# Patient Record
Sex: Female | Born: 1970 | Race: White | Hispanic: No | Marital: Single | Smoking: Former smoker
Health system: Southern US, Community
[De-identification: ages and names within clinical notes are randomized; demographics above are authoritative.]

## PROBLEM LIST (undated history)

## (undated) DIAGNOSIS — E669 Obesity, unspecified: Secondary | ICD-10-CM

## (undated) DIAGNOSIS — G4730 Sleep apnea, unspecified: Secondary | ICD-10-CM

## (undated) DIAGNOSIS — E119 Type 2 diabetes mellitus without complications: Secondary | ICD-10-CM

## (undated) DIAGNOSIS — M1990 Unspecified osteoarthritis, unspecified site: Secondary | ICD-10-CM

## (undated) DIAGNOSIS — I10 Essential (primary) hypertension: Secondary | ICD-10-CM

## (undated) DIAGNOSIS — G629 Polyneuropathy, unspecified: Secondary | ICD-10-CM

## (undated) HISTORY — DX: Sleep apnea, unspecified: G47.30

## (undated) HISTORY — PX: LITHOTRIPSY: SUR834

## (undated) HISTORY — PX: ABDOMINAL HYSTERECTOMY: SHX81

## (undated) HISTORY — PX: ABDOMINAL SURGERY: SHX537

## (undated) HISTORY — DX: Obesity, unspecified: E66.9

## (undated) HISTORY — PX: TONSILLECTOMY: SUR1361

## (undated) HISTORY — PX: CARPAL TUNNEL RELEASE: SHX101

---

## 1999-07-29 ENCOUNTER — Other Ambulatory Visit: Admission: RE | Admit: 1999-07-29 | Discharge: 1999-07-29 | Payer: Self-pay | Admitting: Obstetrics and Gynecology

## 1999-11-26 ENCOUNTER — Emergency Department (HOSPITAL_COMMUNITY): Admission: EM | Admit: 1999-11-26 | Discharge: 1999-11-26 | Payer: Self-pay

## 2000-03-27 ENCOUNTER — Encounter (INDEPENDENT_AMBULATORY_CARE_PROVIDER_SITE_OTHER): Payer: Self-pay

## 2000-03-27 ENCOUNTER — Other Ambulatory Visit: Admission: RE | Admit: 2000-03-27 | Discharge: 2000-03-27 | Payer: Self-pay | Admitting: Otolaryngology

## 2000-07-27 ENCOUNTER — Emergency Department (HOSPITAL_COMMUNITY): Admission: EM | Admit: 2000-07-27 | Discharge: 2000-07-27 | Payer: Self-pay | Admitting: Emergency Medicine

## 2000-07-27 ENCOUNTER — Encounter: Payer: Self-pay | Admitting: *Deleted

## 2000-08-15 ENCOUNTER — Encounter (INDEPENDENT_AMBULATORY_CARE_PROVIDER_SITE_OTHER): Payer: Self-pay | Admitting: Internal Medicine

## 2000-08-15 ENCOUNTER — Ambulatory Visit (HOSPITAL_COMMUNITY): Admission: RE | Admit: 2000-08-15 | Discharge: 2000-08-15 | Payer: Self-pay | Admitting: Internal Medicine

## 2000-08-20 ENCOUNTER — Other Ambulatory Visit: Admission: RE | Admit: 2000-08-20 | Discharge: 2000-08-20 | Payer: Self-pay | Admitting: Obstetrics and Gynecology

## 2000-08-29 ENCOUNTER — Ambulatory Visit (HOSPITAL_COMMUNITY): Admission: RE | Admit: 2000-08-29 | Discharge: 2000-08-29 | Payer: Self-pay | Admitting: Internal Medicine

## 2000-08-29 ENCOUNTER — Encounter: Payer: Self-pay | Admitting: Internal Medicine

## 2000-09-05 ENCOUNTER — Encounter (INDEPENDENT_AMBULATORY_CARE_PROVIDER_SITE_OTHER): Payer: Self-pay | Admitting: Internal Medicine

## 2000-09-05 ENCOUNTER — Ambulatory Visit (HOSPITAL_COMMUNITY): Admission: RE | Admit: 2000-09-05 | Discharge: 2000-09-05 | Payer: Self-pay | Admitting: *Deleted

## 2000-09-30 ENCOUNTER — Emergency Department (HOSPITAL_COMMUNITY): Admission: EM | Admit: 2000-09-30 | Discharge: 2000-09-30 | Payer: Self-pay | Admitting: Emergency Medicine

## 2000-10-15 ENCOUNTER — Encounter: Payer: Self-pay | Admitting: Obstetrics and Gynecology

## 2000-10-15 ENCOUNTER — Inpatient Hospital Stay (HOSPITAL_COMMUNITY): Admission: AD | Admit: 2000-10-15 | Discharge: 2000-10-15 | Payer: Self-pay | Admitting: Obstetrics and Gynecology

## 2000-10-15 ENCOUNTER — Encounter: Admission: RE | Admit: 2000-10-15 | Discharge: 2000-10-15 | Payer: Self-pay | Admitting: Obstetrics and Gynecology

## 2001-03-01 ENCOUNTER — Encounter: Payer: Self-pay | Admitting: *Deleted

## 2001-03-01 ENCOUNTER — Emergency Department (HOSPITAL_COMMUNITY): Admission: EM | Admit: 2001-03-01 | Discharge: 2001-03-01 | Payer: Self-pay | Admitting: *Deleted

## 2001-04-18 ENCOUNTER — Emergency Department (HOSPITAL_COMMUNITY): Admission: EM | Admit: 2001-04-18 | Discharge: 2001-04-18 | Payer: Self-pay | Admitting: *Deleted

## 2001-07-22 ENCOUNTER — Emergency Department (HOSPITAL_COMMUNITY): Admission: EM | Admit: 2001-07-22 | Discharge: 2001-07-22 | Payer: Self-pay | Admitting: Emergency Medicine

## 2001-11-04 ENCOUNTER — Other Ambulatory Visit: Admission: RE | Admit: 2001-11-04 | Discharge: 2001-11-04 | Payer: Self-pay | Admitting: Obstetrics and Gynecology

## 2001-11-06 ENCOUNTER — Observation Stay (HOSPITAL_COMMUNITY): Admission: RE | Admit: 2001-11-06 | Discharge: 2001-11-07 | Payer: Self-pay | Admitting: Obstetrics and Gynecology

## 2001-11-06 ENCOUNTER — Encounter (INDEPENDENT_AMBULATORY_CARE_PROVIDER_SITE_OTHER): Payer: Self-pay

## 2005-01-08 ENCOUNTER — Emergency Department (HOSPITAL_COMMUNITY): Admission: EM | Admit: 2005-01-08 | Discharge: 2005-01-08 | Payer: Self-pay | Admitting: Emergency Medicine

## 2005-01-09 ENCOUNTER — Inpatient Hospital Stay (HOSPITAL_COMMUNITY): Admission: EM | Admit: 2005-01-09 | Discharge: 2005-01-12 | Payer: Self-pay | Admitting: Emergency Medicine

## 2005-01-10 ENCOUNTER — Encounter (INDEPENDENT_AMBULATORY_CARE_PROVIDER_SITE_OTHER): Payer: Self-pay | Admitting: *Deleted

## 2005-01-10 ENCOUNTER — Encounter: Payer: Self-pay | Admitting: Obstetrics and Gynecology

## 2006-06-27 ENCOUNTER — Ambulatory Visit (HOSPITAL_BASED_OUTPATIENT_CLINIC_OR_DEPARTMENT_OTHER): Admission: RE | Admit: 2006-06-27 | Discharge: 2006-06-27 | Payer: Self-pay | Admitting: *Deleted

## 2006-12-30 ENCOUNTER — Emergency Department (HOSPITAL_COMMUNITY): Admission: EM | Admit: 2006-12-30 | Discharge: 2006-12-30 | Payer: Self-pay | Admitting: Emergency Medicine

## 2008-02-28 ENCOUNTER — Encounter: Payer: Self-pay | Admitting: Orthopedic Surgery

## 2008-02-28 ENCOUNTER — Ambulatory Visit (HOSPITAL_COMMUNITY): Admission: RE | Admit: 2008-02-28 | Discharge: 2008-02-28 | Payer: Self-pay | Admitting: Family Medicine

## 2008-03-17 ENCOUNTER — Ambulatory Visit (HOSPITAL_COMMUNITY): Admission: RE | Admit: 2008-03-17 | Discharge: 2008-03-17 | Payer: Self-pay | Admitting: Family Medicine

## 2008-03-17 ENCOUNTER — Encounter: Payer: Self-pay | Admitting: Orthopedic Surgery

## 2008-05-20 ENCOUNTER — Ambulatory Visit: Payer: Self-pay | Admitting: Orthopedic Surgery

## 2008-05-20 DIAGNOSIS — M549 Dorsalgia, unspecified: Secondary | ICD-10-CM | POA: Insufficient documentation

## 2008-05-21 ENCOUNTER — Encounter: Payer: Self-pay | Admitting: Orthopedic Surgery

## 2008-05-25 ENCOUNTER — Encounter: Payer: Self-pay | Admitting: Orthopedic Surgery

## 2008-05-28 ENCOUNTER — Encounter: Payer: Self-pay | Admitting: Orthopedic Surgery

## 2008-06-02 ENCOUNTER — Ambulatory Visit: Payer: Self-pay | Admitting: Orthopedic Surgery

## 2008-07-28 ENCOUNTER — Encounter (HOSPITAL_COMMUNITY): Admission: RE | Admit: 2008-07-28 | Discharge: 2008-08-27 | Payer: Self-pay | Admitting: Orthopedic Surgery

## 2008-07-28 ENCOUNTER — Encounter: Payer: Self-pay | Admitting: Orthopedic Surgery

## 2008-08-28 ENCOUNTER — Encounter (HOSPITAL_COMMUNITY): Admission: RE | Admit: 2008-08-28 | Discharge: 2008-09-27 | Payer: Self-pay | Admitting: Orthopedic Surgery

## 2008-08-31 ENCOUNTER — Encounter: Payer: Self-pay | Admitting: Orthopedic Surgery

## 2008-12-16 ENCOUNTER — Encounter: Payer: Self-pay | Admitting: Orthopedic Surgery

## 2009-03-03 ENCOUNTER — Ambulatory Visit (HOSPITAL_COMMUNITY): Payer: Self-pay | Admitting: Oncology

## 2009-03-03 ENCOUNTER — Encounter (HOSPITAL_COMMUNITY): Admission: RE | Admit: 2009-03-03 | Discharge: 2009-04-02 | Payer: Self-pay | Admitting: Oncology

## 2009-03-10 ENCOUNTER — Ambulatory Visit (HOSPITAL_COMMUNITY): Admission: RE | Admit: 2009-03-10 | Discharge: 2009-03-10 | Payer: Self-pay | Admitting: Oncology

## 2009-04-15 ENCOUNTER — Encounter (HOSPITAL_COMMUNITY): Admission: RE | Admit: 2009-04-15 | Discharge: 2009-05-15 | Payer: Self-pay | Admitting: Oncology

## 2009-06-07 ENCOUNTER — Encounter (HOSPITAL_COMMUNITY): Admission: RE | Admit: 2009-06-07 | Discharge: 2009-07-07 | Payer: Self-pay | Admitting: Oncology

## 2009-06-07 ENCOUNTER — Ambulatory Visit (HOSPITAL_COMMUNITY): Payer: Self-pay | Admitting: Oncology

## 2009-08-16 ENCOUNTER — Encounter (HOSPITAL_COMMUNITY): Admission: RE | Admit: 2009-08-16 | Discharge: 2009-09-15 | Payer: Self-pay | Admitting: Oncology

## 2009-08-16 ENCOUNTER — Ambulatory Visit (HOSPITAL_COMMUNITY): Payer: Self-pay | Admitting: Oncology

## 2010-01-23 ENCOUNTER — Encounter: Payer: Self-pay | Admitting: Family Medicine

## 2010-02-14 ENCOUNTER — Other Ambulatory Visit (HOSPITAL_COMMUNITY): Payer: Self-pay

## 2010-02-16 ENCOUNTER — Ambulatory Visit (HOSPITAL_COMMUNITY): Payer: Self-pay | Admitting: Oncology

## 2010-03-17 LAB — CBC
Hemoglobin: 15.3 g/dL — ABNORMAL HIGH (ref 12.0–15.0)
MCH: 31.3 pg (ref 26.0–34.0)
MCV: 90.8 fL (ref 78.0–100.0)
RBC: 4.89 MIL/uL (ref 3.87–5.11)

## 2010-03-17 LAB — DIFFERENTIAL
Basophils Absolute: 0.1 10*3/uL (ref 0.0–0.1)
Eosinophils Absolute: 0.2 10*3/uL (ref 0.0–0.7)
Monocytes Absolute: 0.8 10*3/uL (ref 0.1–1.0)
Monocytes Relative: 7 % (ref 3–12)
Neutro Abs: 5.6 10*3/uL (ref 1.7–7.7)

## 2010-03-20 ENCOUNTER — Emergency Department (HOSPITAL_COMMUNITY)
Admission: EM | Admit: 2010-03-20 | Discharge: 2010-03-20 | Disposition: A | Discharge status: Home / Self Care | Payer: 59 | Attending: Emergency Medicine | Admitting: Emergency Medicine

## 2010-03-20 DIAGNOSIS — I10 Essential (primary) hypertension: Secondary | ICD-10-CM | POA: Insufficient documentation

## 2010-03-20 DIAGNOSIS — Z79899 Other long term (current) drug therapy: Secondary | ICD-10-CM | POA: Insufficient documentation

## 2010-03-20 DIAGNOSIS — R1031 Right lower quadrant pain: Secondary | ICD-10-CM | POA: Insufficient documentation

## 2010-03-20 LAB — URINALYSIS, ROUTINE W REFLEX MICROSCOPIC
Bilirubin Urine: NEGATIVE
Ketones, ur: NEGATIVE mg/dL
Nitrite: NEGATIVE
Protein, ur: NEGATIVE mg/dL
Specific Gravity, Urine: 1.025 (ref 1.005–1.030)
pH: 6 (ref 5.0–8.0)

## 2010-03-20 LAB — DIFFERENTIAL
Lymphocytes Relative: 46 % (ref 12–46)
Lymphs Abs: 6.2 10*3/uL — ABNORMAL HIGH (ref 0.7–4.0)
Monocytes Absolute: 1 10*3/uL (ref 0.1–1.0)
Monocytes Relative: 8 % (ref 3–12)
Neutro Abs: 6.1 10*3/uL (ref 1.7–7.7)
Neutrophils Relative %: 45 % (ref 43–77)

## 2010-03-20 LAB — COMPREHENSIVE METABOLIC PANEL
ALT: 31 U/L (ref 0–35)
Albumin: 4.1 g/dL (ref 3.5–5.2)
Alkaline Phosphatase: 87 U/L (ref 39–117)
Calcium: 9.4 mg/dL (ref 8.4–10.5)
Creatinine, Ser: 0.81 mg/dL (ref 0.4–1.2)
GFR calc Af Amer: >60 mL/min (ref >60)

## 2010-03-20 LAB — URINE MICROSCOPIC-ADD ON

## 2010-03-20 LAB — CBC
MCH: 31.2 pg (ref 26.0–34.0)
MCV: 90.2 fL (ref 78.0–100.0)
Platelets: 248 10*3/uL (ref 150–400)
RDW: 13 % (ref 11.5–15.5)

## 2010-03-21 LAB — DIFFERENTIAL
Basophils Absolute: 0.1 10*3/uL (ref 0.0–0.1)
Basophils Relative: 1 % (ref 0–1)
Lymphocytes Relative: 42 % (ref 12–46)
Lymphs Abs: 4.8 10*3/uL — ABNORMAL HIGH (ref 0.7–4.0)
Monocytes Absolute: 0.7 10*3/uL (ref 0.1–1.0)

## 2010-03-21 LAB — CBC
HCT: 44.2 % (ref 36.0–46.0)
MCHC: 34.4 g/dL (ref 30.0–36.0)
MCV: 90.8 fL (ref 78.0–100.0)
Platelets: 255 10*3/uL (ref 150–400)
RDW: 12.7 % (ref 11.5–15.5)

## 2010-03-23 LAB — CBC
HCT: 42.5 % (ref 36.0–46.0)
Hemoglobin: 15.1 g/dL — ABNORMAL HIGH (ref 12.0–15.0)
MCHC: 35.6 g/dL (ref 30.0–36.0)
Platelets: 260 10*3/uL (ref 150–400)
RBC: 4.76 MIL/uL (ref 3.87–5.11)

## 2010-03-23 LAB — DIFFERENTIAL
Basophils Absolute: 0 10*3/uL (ref 0.0–0.1)
Eosinophils Absolute: 0.2 10*3/uL (ref 0.0–0.7)
Lymphs Abs: 5 10*3/uL — ABNORMAL HIGH (ref 0.7–4.0)
Monocytes Relative: 7 % (ref 3–12)

## 2010-03-25 LAB — DIFFERENTIAL
Eosinophils Absolute: 0.2 10*3/uL (ref 0.0–0.7)
Eosinophils Relative: 1 % (ref 0–5)
Lymphocytes Relative: 36 % (ref 12–46)
Monocytes Absolute: 0.7 10*3/uL (ref 0.1–1.0)
Monocytes Relative: 5 % (ref 3–12)
Neutro Abs: 7.4 10*3/uL (ref 1.7–7.7)
Neutrophils Relative %: 57 % (ref 43–77)

## 2010-03-25 LAB — CBC
MCHC: 34.7 g/dL (ref 30.0–36.0)
MCV: 92.2 fL (ref 78.0–100.0)
WBC: 13 10*3/uL — ABNORMAL HIGH (ref 4.0–10.5)

## 2010-03-29 ENCOUNTER — Other Ambulatory Visit (HOSPITAL_COMMUNITY): Payer: Self-pay | Admitting: Internal Medicine

## 2010-03-31 ENCOUNTER — Other Ambulatory Visit (HOSPITAL_COMMUNITY): Payer: 59

## 2010-04-04 ENCOUNTER — Ambulatory Visit (HOSPITAL_COMMUNITY)
Admission: RE | Admit: 2010-04-04 | Discharge: 2010-04-04 | Disposition: A | Discharge status: Home / Self Care | Payer: 59 | Source: Ambulatory Visit | Attending: Internal Medicine | Admitting: Internal Medicine

## 2010-04-04 ENCOUNTER — Encounter (HOSPITAL_COMMUNITY): Payer: Self-pay

## 2010-04-04 DIAGNOSIS — N200 Calculus of kidney: Secondary | ICD-10-CM | POA: Insufficient documentation

## 2010-04-04 DIAGNOSIS — R109 Unspecified abdominal pain: Secondary | ICD-10-CM | POA: Insufficient documentation

## 2010-04-04 DIAGNOSIS — R160 Hepatomegaly, not elsewhere classified: Secondary | ICD-10-CM | POA: Insufficient documentation

## 2010-04-04 HISTORY — DX: Essential (primary) hypertension: I10

## 2010-04-04 MED ORDER — IOHEXOL 300 MG/ML  SOLN
100.0000 mL | Freq: Once | INTRAMUSCULAR | Status: AC | PRN
  Administered 2010-04-04: 100 mL via INTRAVENOUS

## 2010-05-17 NOTE — Op Note (Signed)
Kimberly Marks, Kimberly Marks                ACCOUNT NO.:  [id number]   MEDICAL RECORD NO.:  [id number]          PATIENT TYPE:  AMB   LOCATION:  DSC                          FACILITY:  [facility]   PHYSICIAN:  Tennis Must Meyerdierks, M.D.DATE OF BIRTH:  07-06-1970   DATE OF PROCEDURE:  06/27/2006  DATE OF DISCHARGE:                               OPERATIVE REPORT   PREOPERATIVE DIAGNOSIS:  Left carpal tunnel syndrome.   POSTOPERATIVE DIAGNOSIS:  Left carpal tunnel syndrome.   PROCEDURE:  Decompression median nerve left carpal tunnel.   SURGEON:  Lowell Bouton, M.D.   ANESTHESIA:  Half percent Marcaine local with sedation.   OPERATIVE FINDINGS:  The patient had no masses in the carpal canal.  The  motor branch of the nerve was intact.   PROCEDURE:  Under half percent Marcaine local anesthesia with a  tourniquet on the left arm, the left hand was prepped and draped in  usual fashion and after exsanguinating the limb, the tourniquet was  inflated to 250 mmHg.  A 3 cm longitudinal incision was made in the palm  just ulnar to the thenar crease.  Sharp dissection was carried through  the subcutaneous tissues.  Blunt dissection was carried through the  superficial palmar fascia distal to the transverse carpal ligament.  A  hemostat was then placed in the carpal canal up against the hook of  hamate.  The transverse carpal ligament was divided on the ulnar border  of the median nerve and the proximal end of the ligament was divided  with the scissors after dissecting the nerve away from the undersurface  of the ligament.  The carpal canal was then palpated and was found to be  adequately decompressed.  The nerve was examined in the motor branch was  identified.  The wound was irrigated with saline.  The skin was closed  with 4-0 nylon sutures.  Sterile dressings were applied followed by a  volar wrist splint.  The patient tolerated the procedure well, went to  the recovery room awake in  stable condition.      Lowell Bouton, M.D.  Electronically Signed     EMM/MEDQ  D:  06/27/2006  T:  06/27/2006  Job:  [id number]   cc:   Kirk Ruths, M.D.

## 2010-05-20 NOTE — H&P (Signed)
NAMEZELIE, ASBILL                ACCOUNT NO.:  [id number]   MEDICAL RECORD NO.:  [id number]          PATIENT TYPE:  INP   LOCATION:  A417                          FACILITY:  [facility]   PHYSICIAN:  Tilda Burrow, M.D. DATE OF BIRTH:  1970-02-05   DATE OF ADMISSION:  01/09/2005  DATE OF DISCHARGE:  LH                                HISTORY & PHYSICAL   HISTORY OF PRESENT ILLNESS:  This 40 year old female was admitted to [facility] one day after being seen in the emergency room at [facility] for severe lower abdominal pain. She presents back to the [facility] emergency room on the morning of January 09, 2005 complaining  of worsening abdominal pain. When seen January 09, 2004 in the emergency  room, the patient was found to have two weeks of urgent pain reported to be  an acute level of 5/10. On the morning of January 09, 2005, she considers the  pain to be worse. It is interfering with activities. Since the patient is  seen back on January 09, 2005, she will be admitted.   X-ray reports from the day before have been reviewed which show a large  complex cystic and solid mass in the central pelvis measuring 15.4 x 11.4 cm  and maximal of AP diameter 10 cm in the third dimension. Surgically absent  uterus was present. There is no evidence of renal calculi. Laboratory data  included hemoglobin 16, hematocrit 42, white count 13,300. Electrolytes  normal. Urinalysis negative for evidence of infection.   PHYSICAL EXAMINATION:  GENERAL:  Shows a slim Caucasian female. Patient has  significant discomfort.   We will proceed toward surgery once laboratory work evaluation and bowel  prep on January 09, 2005 and surgery morning of January 10, 2005 planned.      Tilda Burrow, M.D.  Electronically Signed     JVF/MEDQ  D:  01/10/2005  T:  01/10/2005  Job:  [id number]

## 2010-05-20 NOTE — Op Note (Signed)
NAMEAASHKA, SALOMONE                ACCOUNT NO.:  [id number]   MEDICAL RECORD NO.:  [id number]          PATIENT TYPE:  INP   LOCATION:  A417                          FACILITY:  [facility]   PHYSICIAN:  Tilda Burrow, M.D. DATE OF BIRTH:  1970-02-17   DATE OF PROCEDURE:  01/10/2005  DATE OF DISCHARGE:                                 OPERATIVE REPORT   PREOPERATIVE DIAGNOSIS:  1.  Symptomatic pelvic mass  2.  Left thigh skin tag.   POSTOPERATIVE DIAGNOSIS:  1.  Right ovarian mass with partial torsion.  2.  Left thigh skin tag.   PROCEDURE:  1.  Laparotomy with bilateral salpingo-oophorectomy.  2.  Pelvic washings.  3.  Excision of left thigh skin tag.   SURGEON:  Dr. Emelda Fear.   FIRST ASSISTANT:  Morrie Sheldon, R.N.   ANESTHESIA:  General, Garner Nash, C.R.N.A.   COMPLICATIONS:  None.   FINDINGS:  Huge mass ____________ right ovary with torsion. No evidence of  metastatic disease, some edema and engorgement of both infundibulopelvic  ligaments. The omental attachments from the left adnexa to epiploic fat.   DETAILS OF PROCEDURE:  The patient was taken operating room, prepped and  draped in the usual fashion for vertical lower abdominal incision. Foley  catheter was placed and Flowtrons were in place for prophylaxis. IV  antibiotics given as prophylaxis. Time out was conducted.   A vertical lower abdominal incision was performed, with easy dissection down  to the fascia which was opened in the midline. Peritoneal cavity was entered  carefully with hemostat elevation of the peritoneal cavity and Metzenbaum  scissors entry. There was no suspicion of intra-abdominal trauma. Pelvic  washings were obtained and set aside. Incision was opened proximally 15 to  18 cm in length from the symphysis pubis toward the umbilicus, and palpation  of the abdomen revealed a large mass that essentially filled the pelvis.  There was some twisting of it, and there was cyanosis but no ischemia of the  adnexal structure. With some difficulty, the specimen could be pulled  through the incision. There was a lot of tissue edema in the right  infundibulopelvic ligament. The ureter could be visualized well out of the  area of surgery. The right IP ligament pedicle could be crossclamped with  two Kelly clamps, and then the specimen transected and removed. We placed a  double ligature over the right pedicle using 0 chromic. Hemostasis was  satisfactory. We again confirmed that the ureter was indeed out of harm's  way.   We then proceeded with inspection of the opposite adnexa where the adnexa  was slightly twisted, and interestingly, the IP ligament was markedly  edematous and somewhat erythematous, possibly from pressure effect or  partial torsion due to the adhesions from the epiploic fat to the tip of the  tube which resulted in some twisting of the specimen. The adhesions were  removed, the IP ligament crossclamped and ligated. Specimens were sent to  pathology.   Pelvis was inspected and confirmed as hemostatic and pelvis irrigated. The  anterior peritoneum closed with 2-0 chromic, fascia closed with  continuous  running 0 Prolene, subcutaneous tissues were reapproximated using 2-0 plain  with staple closure of the skin completing the procedure. Sponge and needle  counts were correct.   ADDENDUM:  During the Foley catheterization at the start of the procedure,  we prepped around the 1-cm skin tag on the left inner thigh, crossclamped  with a hemostat and excised it. Only Steri-Strips and a Band-Aid were  required with good hemostasis noted.      Tilda Burrow, M.D.  Electronically Signed     JVF/MEDQ  D:  01/10/2005  T:  01/10/2005  Job:  [id number]

## 2010-05-20 NOTE — Discharge Summary (Signed)
[name], Kimberly Marks                ACCOUNT NO.:  [id number]   MEDICAL RECORD NO.:  [id number]          PATIENT TYPE:  INP   LOCATION:  A417                          FACILITY:  [facility]   PHYSICIAN:  Tilda Burrow, M.D. DATE OF BIRTH:  October 02, 1970   DATE OF ADMISSION:  01/09/2005  DATE OF DISCHARGE:  01/11/2007LH                                 DISCHARGE SUMMARY   ADMISSION DIAGNOSES:  Symptomatic right ovarian mass.   DISCHARGE DIAGNOSES:  Right ovarian mass with partial torsion, left thigh  skin tag.   PROCEDURES:  1.  Laparotomy with bilateral salpingo-oophorectomy and pelvic washings.  2.  Excision left thigh skin tag.   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   HOSPITAL SUMMARY:  This 40 year old female status post hysterectomy with  recurrent visits to the emergency room due to pain was admitted for  evaluation and probable surgical treatment.  She was admitted to rule out  adnexal torsion with postprocedure laparotomy.  She had the pain reporting  as chronic pain of 5/10 for several days prior to procedure, but it had  gotten worse.  X-ray report showed a complex cystic and solid mass of the  pelvis measuring 15.4x11.4x10 cm with suspicion for torsion due to the  severity of the pain.   HOSPITAL COURSE:  The patient underwent laparotomy with bilateral salpingo-  oophorectomy and pelvic washing and excision of left thigh skin tag on  January 10, 2005, as described in the admitting history.  Pathology report  showed a large mucinous cyst adenoma of the right ovary.  Left tube and  ovary had more physiologic appearance.  Fluid peritoneal washings were  negative, abnormal cells.  EBL was 100 cc.  Postoperative hemoglobin was  12.4, hematocrit 35.7 compared to admitting hemoglobin of 13.5 and  hematocrit 38.0.  Electrolytes showed BUN 9, creatinine 0.8 on admission.  CA-125 was 9.0 as enduring  preoperative evaluation.   POSTOPERATIVE COURSE:  She was admitted on January 8, at 8 a.m. for  abdominal pain after ER evaluation.  She prepared for surgery after the CT  showed the huge mass.  The day of surgery, January 10, 2005, was  uncomplicated.  Postoperative day #1, she had a hemoglobin of 12.4,  hematocrit 35.7.  Postop day #2 was stable for discharge.   DISCHARGE MEDICATIONS:  Tylox one q.6 h p.r.n. pain.      Tilda Burrow, M.D.  Electronically Signed     JVF/MEDQ  D:  02/01/2005  T:  02/02/2005  Job:  [id number]

## 2010-09-04 ENCOUNTER — Emergency Department (HOSPITAL_COMMUNITY): Payer: 59

## 2010-09-04 ENCOUNTER — Emergency Department (HOSPITAL_COMMUNITY)
Admission: EM | Admit: 2010-09-04 | Discharge: 2010-09-04 | Disposition: A | Discharge status: Home / Self Care | Payer: 59 | Attending: Emergency Medicine | Admitting: Emergency Medicine

## 2010-09-04 ENCOUNTER — Encounter (HOSPITAL_COMMUNITY): Payer: Self-pay | Admitting: *Deleted

## 2010-09-04 DIAGNOSIS — R220 Localized swelling, mass and lump, head: Secondary | ICD-10-CM | POA: Insufficient documentation

## 2010-09-04 DIAGNOSIS — Z859 Personal history of malignant neoplasm, unspecified: Secondary | ICD-10-CM | POA: Insufficient documentation

## 2010-09-04 DIAGNOSIS — J392 Other diseases of pharynx: Secondary | ICD-10-CM

## 2010-09-04 DIAGNOSIS — F17200 Nicotine dependence, unspecified, uncomplicated: Secondary | ICD-10-CM | POA: Insufficient documentation

## 2010-09-04 DIAGNOSIS — I10 Essential (primary) hypertension: Secondary | ICD-10-CM | POA: Insufficient documentation

## 2010-09-04 LAB — BASIC METABOLIC PANEL
GFR calc Af Amer: >60 mL/min (ref >60)
GFR calc non Af Amer: >60 mL/min (ref >60)
Glucose, Bld: 124 mg/dL — ABNORMAL HIGH (ref 70–99)
Potassium: 3.7 mEq/L (ref 3.5–5.1)
Sodium: 141 mEq/L (ref 135–145)

## 2010-09-04 LAB — CBC
Hemoglobin: 14.4 g/dL (ref 12.0–15.0)
MCHC: 34.7 g/dL (ref 30.0–36.0)
RDW: 12.9 % (ref 11.5–15.5)
WBC: 10.3 10*3/uL (ref 4.0–10.5)

## 2010-09-04 MED ORDER — SODIUM CHLORIDE 0.9 % IV BOLUS (SEPSIS)
250.0000 mL | Freq: Once | INTRAVENOUS | Status: AC
  Administered 2010-09-04: 250 mL via INTRAVENOUS

## 2010-09-04 MED ORDER — SODIUM CHLORIDE 0.9 % IV SOLN: INTRAVENOUS | Status: DC

## 2010-09-04 MED ORDER — IOHEXOL 300 MG/ML  SOLN
75.0000 mL | Freq: Once | INTRAMUSCULAR | Status: AC | PRN
  Administered 2010-09-04: 75 mL via INTRAVENOUS

## 2010-09-04 NOTE — ED Notes (Signed)
Pt c/o "feeling like her throat is swollen". States that she feels like her throat is closing and she can't breathe. Denies cough, congestion or fever. Woke up like this.

## 2010-09-04 NOTE — ED Provider Notes (Signed)
Scribed for Shelda Jakes, MD, the patient was seen in room APA18/APA18. This chart was scribed by [employer]. The patient's care started at 07:52 CSN: [id number] Arrival date & time: 09/04/2010  7:33 AM  Chief Complaint  Patient presents with  . Sore Throat   HPI Kimberly Marks is a 40 y.o. female with a history of HTN and cancer, who presents to the Emergency Department complaining of a swollen throat, onset AM of 09/04/2010. Patient describes symptoms as a swelling in the "top of the back" of her throat. She reports that "that the rear top roof" of her throat felt really dry. She reports difficulty breathing and swallowing and had to pull down on her neck to breathe. She denies a history of similar symptoms but reports a history of acid reflux. She denies any new foods before going to bed. Denies pain or sore throat. There are no other associated symptoms and no other alleviating or aggravating factors. PCP is Dr Phillips Odor.  HPI ELEMENTS:  Location: Throat  Onset: AM of 09/04/2010 Duration: 2 hours  Timing: constant Context:  as above  Associated symptoms:      Past Medical History  Diagnosis Date  . Hypertension   . Cancer     Past Surgical History  Procedure Date  . Tonsillectomy   . Abdominal surgery   . Abdominal hysterectomy     History reviewed. No pertinent family history.  History  Substance Use Topics  . Smoking status: Current Everyday Smoker -- 1.0 packs/day  . Smokeless tobacco: Not on file  . Alcohol Use: Yes     occasionally    OB History    Grav Para Term Preterm Abortions TAB SAB Ect Mult Living                  Review of Systems  All other systems reviewed and are negative.    Physical Exam  BP 139/94  Pulse 90  Temp(Src) 98.1 F (36.7 C) (Oral)  Resp 18  Ht 5\' 8"  (1.727 m)  Wt 225 lb (102.059 kg)  BMI 34.21 kg/m2  SpO2 94%  Physical Exam  Nursing note and vitals reviewed. Constitutional: She is oriented to person, place,  and time. She appears well-developed and well-nourished. No distress.  HENT:  Head: Normocephalic and atraumatic.  Mouth/Throat: Uvula is midline and oropharynx is clear and moist. No uvula swelling. No oropharyngeal exudate.       The soft palette goes all the way back to the posterior pharyngeal wall.  Eyes: Conjunctivae and EOM are normal. Pupils are equal, round, and reactive to light.  Neck: Neck supple. No tracheal deviation present. No thyromegaly present.  Cardiovascular: Normal rate, regular rhythm and normal heart sounds.   No murmur heard. Pulmonary/Chest: Effort normal and breath sounds normal. No respiratory distress. She has no wheezes. She has no rales.  Abdominal: Soft. Bowel sounds are normal. There is no tenderness. There is no rebound and no guarding.  Musculoskeletal: Normal range of motion. She exhibits no edema.  Lymphadenopathy:    She has no cervical adenopathy.  Neurological: She is alert and oriented to person, place, and time. No cranial nerve deficit.  Skin: Skin is warm and dry. No rash noted. She is not diaphoretic. No erythema.  Psychiatric: She has a normal mood and affect. Her behavior is normal.         ED Course  Procedures  OTHER DATA REVIEWED: Nursing notes, vital signs, and past medical records reviewed.  DIAGNOSTIC STUDIES: Oxygen Saturation is 94% on room air, normal by my interpretation.    LABS / RADIOLOGY:  Results for orders placed during the hospital encounter of 09/04/10  CBC      Component Value Range   WBC 10.3  4.0 - 10.5 (K/uL)   RBC 4.59  3.87 - 5.11 (MIL/uL)   Hemoglobin 14.4  12.0 - 15.0 (g/dL)   HCT 16.1  09.6 - 04.5 (%)   MCV 90.4  78.0 - 100.0 (fL)   MCH 31.4  26.0 - 34.0 (pg)   MCHC 34.7  30.0 - 36.0 (g/dL)   RDW 40.9  81.1 - 91.4 (%)   Platelets 228  150 - 400 (K/uL)  BASIC METABOLIC PANEL      Component Value Range   Sodium 141  135 - 145 (mEq/L)   Potassium 3.7  3.5 - 5.1 (mEq/L)   Chloride 107  96 - 112  (mEq/L)   CO2 25  19 - 32 (mEq/L)   Glucose, Bld 124 (*) 70 - 99 (mg/dL)   BUN 12  6 - 23 (mg/dL)   Creatinine, Ser 7.82  0.50 - 1.10 (mg/dL)   Calcium 9.3  8.4 - 95.6 (mg/dL)   GFR calc non Af Amer >60  >60 (mL/min)   GFR calc Af Amer >60  >60 (mL/min)    Ct Soft Tissue Neck W Contrast  09/04/2010  *RADIOLOGY REPORT*  Clinical Data: 40 year old female with sore throat and difficulty swallowing.  CT NECK WITH CONTRAST  Technique:  Multidetector CT imaging of the neck was performed with intravenous contrast.  Contrast: 75 ml intravenous Omnipaque-300  Comparison: None  Findings: The larynx and pharynx are unremarkable.  No abnormal areas of enhancement or masses are identified. There is no evidence of focal collection.  No enlarged or abnormal-appearing lymph nodes are identified. The vascular structures are unremarkable. The prevertebral soft tissues are within normal limits. The salivary glands and thyroid gland are unremarkable.  No acute or suspicious bony abnormalities are identified.  IMPRESSION: Unremarkable CT neck with contrast.  Original Report Authenticated By: Rosendo Gros, M.D.    ED COURSE / COORDINATION OF CARE: 07:58 - EDMD examined patient and ordered the following Orders Placed This Encounter  Procedures  . CT Soft Tissue Neck W Contrast  . CBC  . 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 HAS BEEN SNORING FOR SEVERAL YEARS.   IMPRESSION: Diagnoses that have been ruled out:  Diagnoses that are still under consideration:  Final diagnoses:    PLAN:  Home. Advised to follow up with PCP in 2 days. The patient is to return the emergency department if there is any worsening of symptoms. I have reviewed the discharge instructions with the patient  CONDITION ON DISCHARGE: Stable  MEDICATIONS GIVEN IN THE E.D.  Medications  bisoprolol-hydrochlorothiazide (ZIAC) 2.5-6.25 MG per tablet (not  administered)  0.9 %  sodium chloride infusion (not administered)  sodium chloride 0.9 % bolus 250 mL (not administered)    DISCHARGE MEDICATIONS: New Prescriptions   No medications on file    SCRIBE ATTESTATION:   I personally performed the services described in this documentation, which was scribed in my presence. The recorded information has been reviewed and considered. Shelda Jakes, MD        Shelda Jakes, MD 09/04/10 [phone]

## 2010-09-04 NOTE — ED Notes (Signed)
Pt states the roof of her mouth feels numb and her throat feels like it is swollen. Pt has no resp distress. Able to take sips of water and swallow without difficulty

## 2010-09-04 NOTE — ED Notes (Signed)
Pt a/ox4. Resp even and unlabored. NAD at this time. Pt stable for d/c. D/C instructions reviewed with pt. Pt verbalized understanding. Pt ambulated with steady gate to POV.

## 2010-09-09 ENCOUNTER — Other Ambulatory Visit (HOSPITAL_COMMUNITY): Payer: Self-pay | Admitting: Internal Medicine

## 2010-09-09 DIAGNOSIS — R1310 Dysphagia, unspecified: Secondary | ICD-10-CM

## 2010-09-09 DIAGNOSIS — F458 Other somatoform disorders: Secondary | ICD-10-CM

## 2010-09-09 DIAGNOSIS — K219 Gastro-esophageal reflux disease without esophagitis: Secondary | ICD-10-CM

## 2010-09-15 ENCOUNTER — Ambulatory Visit (HOSPITAL_COMMUNITY)
Admission: RE | Admit: 2010-09-15 | Discharge: 2010-09-15 | Disposition: A | Discharge status: Home / Self Care | Payer: 59 | Source: Ambulatory Visit | Attending: Internal Medicine | Admitting: Internal Medicine

## 2010-09-15 ENCOUNTER — Other Ambulatory Visit (HOSPITAL_COMMUNITY): Payer: Self-pay | Admitting: Family Medicine

## 2010-09-15 DIAGNOSIS — K449 Diaphragmatic hernia without obstruction or gangrene: Secondary | ICD-10-CM | POA: Insufficient documentation

## 2010-09-15 DIAGNOSIS — R1310 Dysphagia, unspecified: Secondary | ICD-10-CM

## 2010-09-15 DIAGNOSIS — K219 Gastro-esophageal reflux disease without esophagitis: Secondary | ICD-10-CM

## 2010-09-15 DIAGNOSIS — F458 Other somatoform disorders: Secondary | ICD-10-CM

## 2010-09-15 DIAGNOSIS — Z139 Encounter for screening, unspecified: Secondary | ICD-10-CM

## 2010-09-19 ENCOUNTER — Ambulatory Visit (HOSPITAL_COMMUNITY)
Admission: RE | Admit: 2010-09-19 | Discharge: 2010-09-19 | Disposition: A | Discharge status: Home / Self Care | Payer: 59 | Source: Ambulatory Visit | Attending: Family Medicine | Admitting: Family Medicine

## 2010-09-19 DIAGNOSIS — Z139 Encounter for screening, unspecified: Secondary | ICD-10-CM

## 2010-09-19 DIAGNOSIS — Z1231 Encounter for screening mammogram for malignant neoplasm of breast: Secondary | ICD-10-CM | POA: Insufficient documentation

## 2010-10-07 LAB — STREP A DNA PROBE

## 2010-10-07 LAB — RAPID STREP SCREEN (MED CTR MEBANE ONLY): Streptococcus, Group A Screen (Direct): NEGATIVE

## 2010-10-19 LAB — BASIC METABOLIC PANEL
BUN: 10
Chloride: 105
Creatinine, Ser: 0.72

## 2010-10-19 LAB — POCT HEMOGLOBIN-HEMACUE: Operator id: 128471

## 2012-02-26 ENCOUNTER — Encounter (HOSPITAL_COMMUNITY): Payer: Self-pay | Admitting: *Deleted

## 2012-02-26 ENCOUNTER — Emergency Department (HOSPITAL_COMMUNITY): Payer: 59

## 2012-02-26 ENCOUNTER — Emergency Department (HOSPITAL_COMMUNITY)
Admission: EM | Admit: 2012-02-26 | Discharge: 2012-02-26 | Disposition: A | Discharge status: Home / Self Care | Payer: 59 | Attending: Emergency Medicine | Admitting: Emergency Medicine

## 2012-02-26 DIAGNOSIS — I10 Essential (primary) hypertension: Secondary | ICD-10-CM | POA: Insufficient documentation

## 2012-02-26 DIAGNOSIS — R1031 Right lower quadrant pain: Secondary | ICD-10-CM | POA: Insufficient documentation

## 2012-02-26 DIAGNOSIS — Z859 Personal history of malignant neoplasm, unspecified: Secondary | ICD-10-CM | POA: Insufficient documentation

## 2012-02-26 DIAGNOSIS — F17200 Nicotine dependence, unspecified, uncomplicated: Secondary | ICD-10-CM | POA: Insufficient documentation

## 2012-02-26 DIAGNOSIS — R109 Unspecified abdominal pain: Secondary | ICD-10-CM

## 2012-02-26 DIAGNOSIS — Z79899 Other long term (current) drug therapy: Secondary | ICD-10-CM | POA: Insufficient documentation

## 2012-02-26 DIAGNOSIS — Z90710 Acquired absence of both cervix and uterus: Secondary | ICD-10-CM | POA: Insufficient documentation

## 2012-02-26 LAB — URINE MICROSCOPIC-ADD ON

## 2012-02-26 LAB — CBC WITH DIFFERENTIAL/PLATELET
Basophils Relative: 0 % (ref 0–1)
Eosinophils Absolute: 0.2 10*3/uL (ref 0.0–0.7)
Eosinophils Relative: 1 % (ref 0–5)
HCT: 43.6 % (ref 36.0–46.0)
Hemoglobin: 15 g/dL (ref 12.0–15.0)
MCH: 31.2 pg (ref 26.0–34.0)
MCHC: 34.4 g/dL (ref 30.0–36.0)
MCV: 90.6 fL (ref 78.0–100.0)
Monocytes Absolute: 0.7 10*3/uL (ref 0.1–1.0)
Monocytes Relative: 5 % (ref 3–12)
Neutrophils Relative %: 49 % (ref 43–77)

## 2012-02-26 LAB — URINALYSIS, ROUTINE W REFLEX MICROSCOPIC
Glucose, UA: NEGATIVE mg/dL
Leukocytes, UA: NEGATIVE
Nitrite: NEGATIVE
pH: 5.5 (ref 5.0–8.0)

## 2012-02-26 LAB — COMPREHENSIVE METABOLIC PANEL
Albumin: 4.3 g/dL (ref 3.5–5.2)
BUN: 10 mg/dL (ref 6–23)
Calcium: 9.5 mg/dL (ref 8.4–10.5)
Creatinine, Ser: 0.81 mg/dL (ref 0.50–1.10)
GFR calc Af Amer: >90 mL/min (ref >90)
Total Protein: 7.4 g/dL (ref 6.0–8.3)

## 2012-02-26 MED ORDER — TRAMADOL HCL 50 MG PO TABS
50.0000 mg | ORAL_TABLET | Freq: Four times a day (QID) | ORAL | Status: DC | PRN
Start: 2012-02-26 — End: 2013-01-19

## 2012-02-26 NOTE — ED Provider Notes (Signed)
History    This chart was scribed for Benny Lennert, MD by Charolett Bumpers, ED Scribe. The patient was seen in room APA08/APA08. Patient's care was started at 2116.   CSN: [id number]  Arrival date & time 02/26/12  4098   First MD Initiated Contact with Patient 02/26/12 2116      Chief Complaint  Patient presents with  . Abdominal Pain   Kimberly Marks is a 42 y.o. female who presents to the Emergency Department complaining of RLQ abdominal pain that worsened this morning. She reports she has had the same pain intermittently for "a while" but has been gradually worsening. She reports a h/o similar pain in which she had to have surgery to remove a mass. She also has a h/o an abdominal hysterectomy. She denies any nausea, vomiting, diarrhea.   Patient is a 42 y.o. female presenting with abdominal pain. The history is provided by the patient. No language interpreter was used.  Abdominal Pain Pain location:  RLQ Pain radiates to:  Does not radiate Pain severity:  Moderate Onset quality:  Gradual Timing:  Intermittent Progression:  Worsening Chronicity:  Recurrent Associated symptoms: no chest pain, no cough, no diarrhea, no fatigue, no hematuria, no nausea and no vomiting     Past Medical History  Diagnosis Date  . Hypertension   . Cancer     Past Surgical History  Procedure Laterality Date  . Tonsillectomy    . Abdominal hysterectomy    . Abdominal surgery      History reviewed. No pertinent family history.  History  Substance Use Topics  . Smoking status: Current Every Day Smoker -- 1.00 packs/day  . Smokeless tobacco: Not on file  . Alcohol Use: Yes     Comment: occasionally    OB History   Grav Para Term Preterm Abortions TAB SAB Ect Mult Living                  Review of Systems  Constitutional: Negative for fatigue.  HENT: Negative for congestion, sinus pressure and ear discharge.   Eyes: Negative for discharge.  Respiratory: Negative for cough.    Cardiovascular: Negative for chest pain.  Gastrointestinal: Positive for abdominal pain. Negative for nausea, vomiting and diarrhea.  Genitourinary: Negative for frequency and hematuria.  Musculoskeletal: Negative for back pain.  Skin: Negative for rash.  Neurological: Negative for seizures and headaches.  Psychiatric/Behavioral: Negative for hallucinations.  All other systems reviewed and are negative.    Allergies  Review of patient's allergies indicates no known allergies.  Home Medications   Current Outpatient Rx  Name  Route  Sig  Dispense  Refill  . acetaminophen (TYLENOL) 500 MG tablet   Oral   Take 1,000 mg by mouth every 6 (six) hours as needed. For headaches          . albuterol (PROVENTIL HFA;VENTOLIN HFA) 108 (90 BASE) MCG/ACT inhaler   Inhalation   Inhale 2 puffs into the lungs every 6 (six) hours as needed. For shortness of breath          . bisoprolol-hydrochlorothiazide (ZIAC) 2.5-6.25 MG per tablet   Oral   Take 1 tablet by mouth daily.           . diclofenac (VOLTAREN) 75 MG EC tablet   Oral   Take 75 mg by mouth 2 (two) times daily as needed. For back pain            BP 127/88  Pulse 94  Temp(Src) 97.7 F (36.5 C) (Oral)  Resp 20  Ht 5\' 8"  (1.727 m)  Wt 213 lb (96.616 kg)  BMI 32.39 kg/m2  SpO2 99%  Physical Exam  Nursing note and vitals reviewed. Constitutional: She is oriented to person, place, and time. She appears well-developed.  HENT:  Head: Normocephalic and atraumatic.  Mouth/Throat: Oropharynx is clear and moist.  Eyes: Conjunctivae and EOM are normal. No scleral icterus.  Neck: Neck supple. No thyromegaly present.  Cardiovascular: Normal rate, regular rhythm and normal heart sounds.  Exam reveals no gallop and no friction rub.   No murmur heard. Pulmonary/Chest: Effort normal and breath sounds normal. No stridor. No respiratory distress. She has no wheezes. She has no rales. She exhibits no tenderness.  Abdominal: Soft.  Bowel sounds are normal. She exhibits no distension. There is tenderness. There is no rebound.  Minimal RLQ abdominal tenderness.   Musculoskeletal: Normal range of motion. She exhibits no edema.  Lymphadenopathy:    She has no cervical adenopathy.  Neurological: She is oriented to person, place, and time. Coordination normal.  Skin: No rash noted. No erythema.  Psychiatric: She has a normal mood and affect. Her behavior is normal.    ED Course  Procedures (including critical care time)  DIAGNOSTIC STUDIES: Oxygen Saturation is 99% on room air, normal by my interpretation.    COORDINATION OF CARE:  21:28-Discussed planned course of treatment with the patient including a CT of her abdomen, blood work and UA via in and out cath, who is agreeable at this time.   Results for orders placed during the hospital encounter of 02/26/12  CBC WITH DIFFERENTIAL      Result Value Range   WBC 13.2 (*) 4.0 - 10.5 K/uL   RBC 4.81  3.87 - 5.11 MIL/uL   Hemoglobin 15.0  12.0 - 15.0 g/dL   HCT 96.0  45.4 - 09.8 %   MCV 90.6  78.0 - 100.0 fL   MCH 31.2  26.0 - 34.0 pg   MCHC 34.4  30.0 - 36.0 g/dL   RDW 11.9  14.7 - 82.9 %   Platelets 253  150 - 400 K/uL   Neutrophils Relative 49  43 - 77 %   Neutro Abs 6.4  1.7 - 7.7 K/uL   Lymphocytes Relative 45  12 - 46 %   Lymphs Abs 5.9 (*) 0.7 - 4.0 K/uL   Monocytes Relative 5  3 - 12 %   Monocytes Absolute 0.7  0.1 - 1.0 K/uL   Eosinophils Relative 1  0 - 5 %   Eosinophils Absolute 0.2  0.0 - 0.7 K/uL   Basophils Relative 0  0 - 1 %   Basophils Absolute 0.0  0.0 - 0.1 K/uL  COMPREHENSIVE METABOLIC PANEL      Result Value Range   Sodium 138  135 - 145 mEq/L   Potassium 3.5  3.5 - 5.1 mEq/L   Chloride 101  96 - 112 mEq/L   CO2 27  19 - 32 mEq/L   Glucose, Bld 108 (*) 70 - 99 mg/dL   BUN 10  6 - 23 mg/dL   Creatinine, Ser 5.62  0.50 - 1.10 mg/dL   Calcium 9.5  8.4 - 13.0 mg/dL   Total Protein 7.4  6.0 - 8.3 g/dL   Albumin 4.3  3.5 - 5.2 g/dL    AST 19  0 - 37 U/L   ALT 30  0 - 35 U/L   Alkaline Phosphatase 100  39 - 117 U/L   Total Bilirubin 0.2 (*) 0.3 - 1.2 mg/dL   GFR calc non Af Amer 88 (*) >90 mL/min   GFR calc Af Amer >90  >90 mL/min  URINALYSIS, ROUTINE W REFLEX MICROSCOPIC      Result Value Range   Color, Urine YELLOW  YELLOW   APPearance CLEAR  CLEAR   Specific Gravity, Urine >1.030 (*) 1.005 - 1.030   pH 5.5  5.0 - 8.0   Glucose, UA NEGATIVE  NEGATIVE mg/dL   Hgb urine dipstick NEGATIVE  NEGATIVE   Bilirubin Urine NEGATIVE  NEGATIVE   Ketones, ur NEGATIVE  NEGATIVE mg/dL   Protein, ur TRACE (*) NEGATIVE mg/dL   Urobilinogen, UA 0.2  0.0 - 1.0 mg/dL   Nitrite NEGATIVE  NEGATIVE   Leukocytes, UA NEGATIVE  NEGATIVE  URINE MICROSCOPIC-ADD ON      Result Value Range   Squamous Epithelial / LPF FEW (*) RARE   RBC / HPF 0-2  <3 RBC/hpf   Urine-Other MUCOUS PRESENT      Ct Abdomen Pelvis Wo Contrast  02/26/2012  *RADIOLOGY REPORT*  Clinical Data: 41 year old female with abdominal and pelvic pain.  CT ABDOMEN AND PELVIS WITHOUT CONTRAST  Technique:  Multidetector CT imaging of the abdomen and pelvis was performed following the standard protocol without intravenous contrast.  Comparison: 04/04/2010 CT  Findings: Fatty infiltration of the liver again identified. The spleen, adrenal glands, gallbladder, pancreas and kidneys are unremarkable except for a 7 mm nonobstructing right upper pole renal calculus. Please note that parenchymal abnormalities may be missed as intravenous contrast was not administered. No free fluid, enlarged lymph nodes, biliary dilation or abdominal aortic aneurysm identified. The patient is status post hysterectomy.  The bowel, appendix and bladder are unremarkable. No acute or suspicious bony abnormalities are identified.  IMPRESSION: No evidence of acute abnormality.  7 mm nonobstructing right upper pole renal calculus.  Fatty infiltration of the liver.   Original Report Authenticated By: Harmon Pier, M.D.       No diagnosis found.    MDM  The chart was scribed for me under my direct supervision.  I personally performed the history, physical, and medical decision making and all procedures in the evaluation of this patient.Benny Lennert, MD 02/26/12 [phone]

## 2012-02-26 NOTE — ED Notes (Signed)
Rt abd pain, onset this am.  No NVD.

## 2013-01-19 ENCOUNTER — Emergency Department (HOSPITAL_COMMUNITY)
Admission: EM | Admit: 2013-01-19 | Discharge: 2013-01-19 | Disposition: A | Discharge status: Home / Self Care | Payer: 59 | Attending: Emergency Medicine | Admitting: Emergency Medicine

## 2013-01-19 ENCOUNTER — Encounter (HOSPITAL_COMMUNITY): Payer: Self-pay | Admitting: Emergency Medicine

## 2013-01-19 DIAGNOSIS — K089 Disorder of teeth and supporting structures, unspecified: Secondary | ICD-10-CM | POA: Insufficient documentation

## 2013-01-19 DIAGNOSIS — K029 Dental caries, unspecified: Secondary | ICD-10-CM | POA: Insufficient documentation

## 2013-01-19 DIAGNOSIS — K0889 Other specified disorders of teeth and supporting structures: Secondary | ICD-10-CM

## 2013-01-19 DIAGNOSIS — H9209 Otalgia, unspecified ear: Secondary | ICD-10-CM | POA: Insufficient documentation

## 2013-01-19 DIAGNOSIS — I10 Essential (primary) hypertension: Secondary | ICD-10-CM | POA: Insufficient documentation

## 2013-01-19 DIAGNOSIS — F17200 Nicotine dependence, unspecified, uncomplicated: Secondary | ICD-10-CM | POA: Insufficient documentation

## 2013-01-19 DIAGNOSIS — J3489 Other specified disorders of nose and nasal sinuses: Secondary | ICD-10-CM | POA: Insufficient documentation

## 2013-01-19 MED ORDER — HYDROCODONE-ACETAMINOPHEN 5-325 MG PO TABS
1.0000 | ORAL_TABLET | Freq: Once | ORAL | Status: AC
  Administered 2013-01-19: 1 via ORAL
  Filled 2013-01-19: qty 1

## 2013-01-19 MED ORDER — CLINDAMYCIN HCL 150 MG PO CAPS
300.0000 mg | ORAL_CAPSULE | Freq: Once | ORAL | Status: AC
  Administered 2013-01-19: 300 mg via ORAL
  Filled 2013-01-19: qty 2

## 2013-01-19 MED ORDER — CLINDAMYCIN HCL 150 MG PO CAPS: 300.0000 mg | ORAL_CAPSULE | Freq: Four times a day (QID) | ORAL | Status: DC

## 2013-01-19 MED ORDER — HYDROCODONE-ACETAMINOPHEN 5-325 MG PO TABS: ORAL_TABLET | ORAL | Status: DC

## 2013-01-19 NOTE — ED Provider Notes (Signed)
CSN: [id number]     Arrival date & time 01/19/13  1322 History   First MD Initiated Contact with Patient 01/19/13 1410     Chief Complaint  Patient presents with  . Facial Pain   (Consider location/radiation/quality/duration/timing/severity/associated sxs/prior Treatment) HPI Comments: Kimberly Marks is a 43 y.o. female who presents to the Emergency Department complaining of pain to her right upper teeth and right face.  States the pain has been waxing and waning for weeks.  Pain has been persistent for 2 days.  Also c/o dental caries, ear fullness and pain and sinus congestion.  She denies facial weakness, fever, vomiting, visual changes, neck pain or stiffness.  The history is provided by the patient.    Past Medical History  Diagnosis Date  . Hypertension    Past Surgical History  Procedure Laterality Date  . Tonsillectomy    . Abdominal hysterectomy    . Abdominal surgery     No family history on file. History  Substance Use Topics  . Smoking status: Current Every Day Smoker -- 1.00 packs/day  . Smokeless tobacco: Not on file  . Alcohol Use: Yes     Comment: occasionally   OB History   Grav Para Term Preterm Abortions TAB SAB Ect Mult Living                 Review of Systems  Constitutional: Negative for fever and appetite change.  HENT: Positive for congestion, dental problem, rhinorrhea and sinus pressure. Negative for facial swelling, sore throat and trouble swallowing.   Eyes: Negative for pain and visual disturbance.  Musculoskeletal: Negative for neck pain and neck stiffness.  Neurological: Negative for dizziness, syncope, facial asymmetry, light-headedness, numbness and headaches.  Hematological: Negative for adenopathy.  All other systems reviewed and are negative.    Allergies  Review of patient's allergies indicates no known allergies.  Home Medications   Current Outpatient Rx  Name  Route  Sig  Dispense  Refill  . bisoprolol-hydrochlorothiazide  (ZIAC) 2.5-6.25 MG per tablet   Oral   Take 1 tablet by mouth at bedtime.          . Calcium Carbonate Antacid (ANTACID PO)   Oral   Take 1 tablet by mouth daily as needed (acid reflux).          BP 136/97  Pulse 88  Temp(Src) 98.6 F (37 C) (Oral)  Resp 18  Ht 5\' 8"  (1.727 m)  Wt 220 lb (99.791 kg)  BMI 33.46 kg/m2  SpO2 97% Physical Exam  Nursing note and vitals reviewed. Constitutional: She is oriented to person, place, and time. She appears well-developed and well-nourished. No distress.  HENT:  Head: Normocephalic and atraumatic.  Right Ear: Tympanic membrane and ear canal normal.  Left Ear: Tympanic membrane and ear canal normal.  Nose: Mucosal edema present. Right sinus exhibits no maxillary sinus tenderness and no frontal sinus tenderness.  Mouth/Throat: Uvula is midline, oropharynx is clear and moist and mucous membranes are normal. No trismus in the jaw. Dental caries present. No dental abscesses or uvula swelling.    Dental caries of the right upper first molar.  No facial swelling, obvious dental abscess, trismus, or sublingual abnml.    Eyes: Conjunctivae are normal. Pupils are equal, round, and reactive to light.  Neck: Normal range of motion. Neck supple. No thyromegaly present.  Cardiovascular: Normal rate, regular rhythm and normal heart sounds.   No murmur heard. Pulmonary/Chest: Effort normal and breath sounds normal. No  respiratory distress.  Musculoskeletal: Normal range of motion. She exhibits no edema.  Lymphadenopathy:    She has no cervical adenopathy.  Neurological: She is alert and oriented to person, place, and time. She exhibits normal muscle tone. Coordination normal.  Skin: Skin is warm and dry.    ED Course  Procedures (including critical care time) Labs Review Labs Reviewed - No data to display Imaging Review No results found.  EKG Interpretation   None       MDM   Patient with facial pain and dental caries,  Sx's likely  related to dental decay.  No obvious abscess, airway patent.  No concerning sx's for Ludwig's angina.  Pt agrees to clindamycin and vicodin #12 and close f/u with dentist.     Rondel Episcopo L. Vanessa [place], PA-C 01/19/13 2219

## 2013-01-19 NOTE — Discharge Instructions (Signed)
Dental Pain Toothache is pain in or around a tooth. It may get worse with chewing or with cold or heat.  HOME CARE  Your dentist may use a numbing medicine during treatment. If so, you may need to avoid eating until the medicine wears off. Ask your dentist about this.  Only take medicine as told by your dentist or doctor.  Avoid chewing food near the painful tooth until after all treatment is done. Ask your dentist about this. GET HELP RIGHT AWAY IF:   The problem gets worse or new problems appear.  You have a fever.  There is redness and puffiness (swelling) of the face, jaw, or neck.  You cannot open your mouth.  There is pain in the jaw.  There is very bad pain that is not helped by medicine. MAKE SURE YOU:   Understand these instructions.  Will watch your condition.  Will get help right away if you are not doing well or get worse. Document Released: 06/07/2007 Document Revised: 03/13/2011 Document Reviewed: 06/07/2007 [facility] Patient Information 2014 [place], [place].

## 2013-01-19 NOTE — ED Notes (Signed)
Pain to right side of face and ear pain. States tooth pain also. Pain to right ear. Pt states she is unsure if sinuses are causing the pain or if pain is coming from dental pain.

## 2013-01-20 NOTE — ED Provider Notes (Signed)
Medical screening examination/treatment/procedure(s) were performed by non-physician practitioner and as supervising physician I was immediately available for consultation/collaboration.  EKG Interpretation   None        Nat Christen, MD 01/20/13 2126

## 2013-02-18 ENCOUNTER — Encounter (HOSPITAL_COMMUNITY): Payer: Self-pay | Admitting: Emergency Medicine

## 2013-02-18 ENCOUNTER — Emergency Department (HOSPITAL_COMMUNITY)
Admission: EM | Admit: 2013-02-18 | Discharge: 2013-02-18 | Disposition: A | Discharge status: Home / Self Care | Payer: 59 | Attending: Emergency Medicine | Admitting: Emergency Medicine

## 2013-02-18 DIAGNOSIS — R0600 Dyspnea, unspecified: Secondary | ICD-10-CM

## 2013-02-18 DIAGNOSIS — R0989 Other specified symptoms and signs involving the circulatory and respiratory systems: Principal | ICD-10-CM | POA: Insufficient documentation

## 2013-02-18 DIAGNOSIS — R0609 Other forms of dyspnea: Secondary | ICD-10-CM | POA: Insufficient documentation

## 2013-02-18 DIAGNOSIS — F17200 Nicotine dependence, unspecified, uncomplicated: Secondary | ICD-10-CM | POA: Insufficient documentation

## 2013-02-18 DIAGNOSIS — R110 Nausea: Secondary | ICD-10-CM | POA: Insufficient documentation

## 2013-02-18 DIAGNOSIS — I10 Essential (primary) hypertension: Secondary | ICD-10-CM | POA: Insufficient documentation

## 2013-02-18 LAB — CBC WITH DIFFERENTIAL/PLATELET
Basophils Absolute: 0 10*3/uL (ref 0.0–0.1)
Basophils Relative: 0 % (ref 0–1)
EOS ABS: 0.1 10*3/uL (ref 0.0–0.7)
Eosinophils Relative: 1 % (ref 0–5)
HEMATOCRIT: 44.4 % (ref 36.0–46.0)
HEMOGLOBIN: 15.5 g/dL — AB (ref 12.0–15.0)
LYMPHS ABS: 3.6 10*3/uL (ref 0.7–4.0)
Lymphocytes Relative: 33 % (ref 12–46)
MCH: 31 pg (ref 26.0–34.0)
MCHC: 34.9 g/dL (ref 30.0–36.0)
MCV: 88.8 fL (ref 78.0–100.0)
MONOS PCT: 8 % (ref 3–12)
Monocytes Absolute: 0.8 10*3/uL (ref 0.1–1.0)
NEUTROS ABS: 6.4 10*3/uL (ref 1.7–7.7)
NEUTROS PCT: 59 % (ref 43–77)
Platelets: 245 10*3/uL (ref 150–400)
RBC: 5 MIL/uL (ref 3.87–5.11)
RDW: 12.8 % (ref 11.5–15.5)
WBC: 10.9 10*3/uL — ABNORMAL HIGH (ref 4.0–10.5)

## 2013-02-18 LAB — URINALYSIS, ROUTINE W REFLEX MICROSCOPIC
Bilirubin Urine: NEGATIVE
Glucose, UA: NEGATIVE mg/dL
Hgb urine dipstick: NEGATIVE
KETONES UR: NEGATIVE mg/dL
LEUKOCYTES UA: NEGATIVE
NITRITE: NEGATIVE
PROTEIN: NEGATIVE mg/dL
Specific Gravity, Urine: 1.01 (ref 1.005–1.030)
Urobilinogen, UA: 0.2 mg/dL (ref 0.0–1.0)
pH: 5.5 (ref 5.0–8.0)

## 2013-02-18 LAB — COMPREHENSIVE METABOLIC PANEL
ALK PHOS: 109 U/L (ref 39–117)
ALT: 31 U/L (ref 0–35)
AST: 20 U/L (ref 0–37)
Albumin: 4.2 g/dL (ref 3.5–5.2)
BILIRUBIN TOTAL: 0.3 mg/dL (ref 0.3–1.2)
BUN: 13 mg/dL (ref 6–23)
CHLORIDE: 102 meq/L (ref 96–112)
CO2: 23 mEq/L (ref 19–32)
Calcium: 9.3 mg/dL (ref 8.4–10.5)
Creatinine, Ser: 0.69 mg/dL (ref 0.50–1.10)
GFR calc non Af Amer: >90 mL/min (ref >90)
GLUCOSE: 143 mg/dL — AB (ref 70–99)
POTASSIUM: 4 meq/L (ref 3.7–5.3)
Sodium: 140 mEq/L (ref 137–147)
Total Protein: 7.5 g/dL (ref 6.0–8.3)

## 2013-02-18 LAB — TROPONIN I: Troponin I: <0.3 ng/mL (ref <0.30)

## 2013-02-18 MED ORDER — LORAZEPAM 2 MG/ML IJ SOLN
1.0000 mg | Freq: Once | INTRAMUSCULAR | Status: AC
  Administered 2013-02-18: 03:00:00 via INTRAVENOUS
  Filled 2013-02-18: qty 1

## 2013-02-18 MED ORDER — LORAZEPAM 1 MG PO TABS: 1.0000 mg | ORAL_TABLET | Freq: Three times a day (TID) | ORAL | Status: DC | PRN

## 2013-02-18 NOTE — ED Provider Notes (Signed)
CSN: [id number]     Arrival date & time 02/18/13  0243 History   First MD Initiated Contact with Patient 02/18/13 0251     Chief Complaint  Patient presents with  . Shortness of Breath     (Consider location/radiation/quality/duration/timing/severity/associated sxs/prior Treatment) Patient is a 43 y.o. female presenting with shortness of breath. The history is provided by the patient.  Shortness of Breath She has been having episodes where she gets very hot and then feels dyspneic. There is some associated nausea. Symptoms wax and wane for several hours before subsiding. She has had approximately 5 episodes over the past month but it is the worst. There is no chest pain, heaviness, tightness, pressure. There's been no diaphoresis. Tonight, she went outside and noted that it was snowing and was worried that she wouldn't be able to get somewhere if she had to go, and that seemed to make his symptoms worse. She does relate that she is scheduled for surgery on an infected tooth that is scheduled for 2 days from now.  Past Medical History  Diagnosis Date  . Hypertension    Past Surgical History  Procedure Laterality Date  . Tonsillectomy    . Abdominal hysterectomy    . Abdominal surgery     History reviewed. No pertinent family history. History  Substance Use Topics  . Smoking status: Current Every Day Smoker -- 1.00 packs/day  . Smokeless tobacco: Not on file  . Alcohol Use: Yes     Comment: occasionally   OB History   Grav Para Term Preterm Abortions TAB SAB Ect Mult Living                 Review of Systems  Respiratory: Positive for shortness of breath.   All other systems reviewed and are negative.      Allergies  Review of patient's allergies indicates no known allergies.  Home Medications   Current Outpatient Rx  Name  Route  Sig  Dispense  Refill  . bisoprolol-hydrochlorothiazide (ZIAC) 2.5-6.25 MG per tablet   Oral   Take 1 tablet by mouth at bedtime.         . Calcium Carbonate Antacid (ANTACID PO)   Oral   Take 1 tablet by mouth daily as needed (acid reflux).         . clindamycin (CLEOCIN) 150 MG capsule   Oral   Take 2 capsules (300 mg total) by mouth 4 (four) times daily. For 7 days   56 capsule   0   . HYDROcodone-acetaminophen (NORCO/VICODIN) 5-325 MG per tablet      Take one-two tabs po q 4-6 hrs prn pain   12 tablet   0    BP 140/91  Pulse 70  Temp(Src) 97.7 F (36.5 C) (Oral)  Resp 17  Ht 5\' 8"  (1.727 m)  Wt 230 lb (104.327 kg)  BMI 34.98 kg/m2  SpO2 95% Physical Exam  Nursing note and vitals reviewed.  43 year old female, who is somewhat anxious, but is in no acute distress. Vital signs are significant for borderline hypertension with blood pressure 140/91. Oxygen saturation is 95%, which is normal. Head is normocephalic and atraumatic. PERRLA, EOMI. Oropharynx is clear. Neck is nontender and supple without adenopathy or JVD. Back is nontender and there is no CVA tenderness. Lungs are clear without rales, wheezes, or rhonchi. Chest is nontender. Heart has regular rate and rhythm without murmur. Abdomen is soft, flat, nontender without masses or hepatosplenomegaly and peristalsis is normoactive.  Extremities have no cyanosis or edema, full range of motion is present. Skin is warm and dry without rash. Neurologic: Mental status is normal, cranial nerves are intact, there are no motor or sensory deficits.  ED Course  Procedures (including critical care time) Labs Review Results for orders placed during the hospital encounter of 02/18/13  CBC WITH DIFFERENTIAL      Result Value Ref Range   WBC 10.9 (*) 4.0 - 10.5 K/uL   RBC 5.00  3.87 - 5.11 MIL/uL   Hemoglobin 15.5 (*) 12.0 - 15.0 g/dL   HCT 44.4  36.0 - 46.0 %   MCV 88.8  78.0 - 100.0 fL   MCH 31.0  26.0 - 34.0 pg   MCHC 34.9  30.0 - 36.0 g/dL   RDW 12.8  11.5 - 15.5 %   Platelets 245  150 - 400 K/uL   Neutrophils Relative % 59  43 - 77 %   Neutro  Abs 6.4  1.7 - 7.7 K/uL   Lymphocytes Relative 33  12 - 46 %   Lymphs Abs 3.6  0.7 - 4.0 K/uL   Monocytes Relative 8  3 - 12 %   Monocytes Absolute 0.8  0.1 - 1.0 K/uL   Eosinophils Relative 1  0 - 5 %   Eosinophils Absolute 0.1  0.0 - 0.7 K/uL   Basophils Relative 0  0 - 1 %   Basophils Absolute 0.0  0.0 - 0.1 K/uL  COMPREHENSIVE METABOLIC PANEL      Result Value Ref Range   Sodium 140  137 - 147 mEq/L   Potassium 4.0  3.7 - 5.3 mEq/L   Chloride 102  96 - 112 mEq/L   CO2 23  19 - 32 mEq/L   Glucose, Bld 143 (*) 70 - 99 mg/dL   BUN 13  6 - 23 mg/dL   Creatinine, Ser 0.69  0.50 - 1.10 mg/dL   Calcium 9.3  8.4 - 10.5 mg/dL   Total Protein 7.5  6.0 - 8.3 g/dL   Albumin 4.2  3.5 - 5.2 g/dL   AST 20  0 - 37 U/L   ALT 31  0 - 35 U/L   Alkaline Phosphatase 109  39 - 117 U/L   Total Bilirubin 0.3  0.3 - 1.2 mg/dL   GFR calc non Af Amer >90  >90 mL/min   GFR calc Af Amer >90  >90 mL/min  URINALYSIS, ROUTINE W REFLEX MICROSCOPIC      Result Value Ref Range   Color, Urine YELLOW  YELLOW   APPearance CLEAR  CLEAR   Specific Gravity, Urine 1.010  1.005 - 1.030   pH 5.5  5.0 - 8.0   Glucose, UA NEGATIVE  NEGATIVE mg/dL   Hgb urine dipstick NEGATIVE  NEGATIVE   Bilirubin Urine NEGATIVE  NEGATIVE   Ketones, ur NEGATIVE  NEGATIVE mg/dL   Protein, ur NEGATIVE  NEGATIVE mg/dL   Urobilinogen, UA 0.2  0.0 - 1.0 mg/dL   Nitrite NEGATIVE  NEGATIVE   Leukocytes, UA NEGATIVE  NEGATIVE  TROPONIN I      Result Value Ref Range   Troponin I <0.30  <0.30 ng/mL    EKG Interpretation    Date/Time:  Tuesday February 18 2013 03:21:40 EST Ventricular Rate:  79 PR Interval:  140 QRS Duration: 96 QT Interval:  406 QTC Calculation: 465 R Axis:   61 Text Interpretation:  Normal sinus rhythm ST \\T \ T wave abnormality, consider anterior ischemia Prolonged QT  Abnormal ECG No previous ECGs available Confirmed by [facility]  MD, Demiana Crumbley ([id number]) on 02/18/2013 3:40:51 AM            MDM   Final diagnoses:   Dyspnea    Rather bizarre symptom complex which may represent anxiety. Screening labs and ECG and chest x-ray or be obtained and she'll be given a trial of lorazepam. Old records are reviewed and she has no relevant past visits.  She feels much better after lorazepam. Laboratory workup is unremarkable. She is referred back to her PCP for outpatient workup and she is given a prescription for lorazepam.  Delora Fuel, MD [id number]

## 2013-02-18 NOTE — ED Notes (Signed)
Pt states she gets real hot and then feels sob intermittently. Pt states she gets like this at bedtime sometimes.

## 2013-02-18 NOTE — Discharge Instructions (Signed)
Shortness of Breath Shortness of breath means you have trouble breathing. Shortness of breath may indicate that you have a medical problem. You should seek immediate medical care for shortness of breath. CAUSES   Not enough oxygen in the air (as with high altitudes or a smoke-filled room).  Short-term (acute) lung disease, including:  Infections, such as pneumonia.  Fluid in the lungs, such as heart failure.  A blood clot in the lungs (pulmonary embolism).  Long-term (chronic) lung diseases.  Heart disease (heart attack, angina, heart failure, and others).  Low red blood cells (anemia).  Poor physical fitness. This can cause shortness of breath when you exercise.  Chest or back injuries or stiffness.  Being overweight.  Smoking.  Anxiety. This can make you feel like you are not getting enough air. DIAGNOSIS  Serious medical problems can usually be found during your physical exam. Tests may also be done to determine why you are having shortness of breath. Tests may include:  Chest X-rays.  Lung function tests.  Blood tests.  Electrocardiography.  Exercise testing.  Echocardiography.  Imaging scans. Your caregiver may not be able to find a cause for your shortness of breath after your exam. In this case, it is important to have a follow-up exam with your caregiver as directed.  TREATMENT  Treatment for shortness of breath depends on the cause of your symptoms and can vary greatly. HOME CARE INSTRUCTIONS   Do not smoke. Smoking is a common cause of shortness of breath. If you smoke, ask for help to quit.  Avoid being around chemicals or things that may bother your breathing, such as paint fumes and dust.  Rest as needed. Slowly resume your usual activities.  If medicines were prescribed, take them as directed for the full length of time directed. This includes oxygen and any inhaled medicines.  Keep all follow-up appointments as directed by your caregiver. SEEK  MEDICAL CARE IF:   Your condition does not improve in the time expected.  You have a hard time doing your normal activities even with rest.  You have any side effects or problems with the medicines prescribed.  You develop any new symptoms. SEEK IMMEDIATE MEDICAL CARE IF:   Your shortness of breath gets worse.  You feel lightheaded, faint, or develop a cough not controlled with medicines.  You start coughing up blood.  You have pain with breathing.  You have chest pain or pain in your arms, shoulders, or abdomen.  You have a fever.  You are unable to walk up stairs or exercise the way you normally do. MAKE SURE YOU:  Understand these instructions.  Will watch your condition.  Will get help right away if you are not doing well or get worse. Document Released: 09/13/2000 Document Revised: 06/20/2011 Document Reviewed: 03/06/2011 [facility] Patient Information 2014 [place].  Generalized Anxiety Disorder Generalized anxiety disorder (GAD) is a mental disorder. It interferes with life functions, including relationships, work, and school. GAD is different from normal anxiety, which everyone experiences at some point in their lives in response to specific life events and activities. Normal anxiety actually helps [place] prepare for and get through these life events and activities. Normal anxiety goes away after the event or activity is over.  GAD causes anxiety that is not necessarily related to specific events or activities. It also causes excess anxiety in proportion to specific events or activities. The anxiety associated with GAD is also difficult to control. GAD can vary from mild to severe.  People with severe GAD can have intense waves of anxiety with physical symptoms (panic attacks).  SYMPTOMS The anxiety and worry associated with GAD are difficult to control. This anxiety and worry are related to many life events and activities and also occur more days than not for 6 months  or longer. People with GAD also have three or more of the following symptoms (one or more in children):  Restlessness.   Fatigue.  Difficulty concentrating.   Irritability.  Muscle tension.  Difficulty sleeping or unsatisfying sleep. DIAGNOSIS GAD is diagnosed through an assessment by your caregiver. Your caregiver will ask you questions aboutyour mood,physical symptoms, and events in your life. Your caregiver may ask you about your medical history and use of alcohol or drugs, including prescription medications. Your caregiver may also do a physical exam and blood tests. Certain medical conditions and the use of certain substances can cause symptoms similar to those associated with GAD. Your caregiver may refer you to a mental health specialist for further evaluation. TREATMENT The following therapies are usually used to treat GAD:   Medication Antidepressant medication usually is prescribed for long-term daily control. Antianxiety medications may be added in severe cases, especially when panic attacks occur.   Talk therapy (psychotherapy) Certain types of talk therapy can be helpful in treating GAD by providing support, education, and guidance. A form of talk therapy called cognitive behavioral therapy can teach you healthy ways to think about and react to daily life events and activities.  Stress managementtechniques These include yoga, meditation, and exercise and can be very helpful when they are practiced regularly. A mental health specialist can help determine which treatment is best for you. Some people see improvement with one therapy. However, other people require a combination of therapies. Document Released: 04/15/2012 Document Reviewed: 04/15/2012 [facility] Patient Information 2014 [place], [place].  Lorazepam tablets What is this medicine? LORAZEPAM (lor A ze pam) is a benzodiazepine. It is used to treat anxiety. This medicine may be used for other purposes; ask your  health care provider or pharmacist if you have questions. COMMON BRAND NAME(S): Ativan What should I tell my health care provider before I take this medicine? They need to know if you have any of these conditions: -alcohol or drug abuse problem -bipolar disorder, depression, psychosis or other mental health condition -glaucoma -kidney or liver disease -lung disease or breathing difficulties -myasthenia gravis -Parkinson's disease -seizures or a history of seizures -suicidal thoughts -an unusual or allergic reaction to lorazepam, other benzodiazepines, foods, dyes, or preservatives -pregnant or trying to get pregnant -breast-feeding How should I use this medicine? Take this medicine by mouth with a glass of water. Follow the directions on the prescription label. If it upsets your stomach, take it with food or milk. Take your medicine at regular intervals. Do not take it more often than directed. Do not stop taking except on the advice of your doctor or health care professional. Talk to your pediatrician regarding the use of this medicine in children. Special care may be needed. Overdosage: If you think you have taken too much of this medicine contact a poison control center or emergency room at once. NOTE: This medicine is only for you. Do not share this medicine with others. What if I miss a dose? If you miss a dose, take it as soon as you can. If it is almost time for your next dose, take only that dose. Do not take double or extra doses. What may interact with this medicine? -  barbiturate medicines for inducing sleep or treating seizures, like phenobarbital -clozapine -medicines for depression, mental problems or psychiatric disturbances -medicines for sleep -phenytoin -probenecid -theophylline -valproic acid This list may not describe all possible interactions. Give your health care provider a list of all the medicines, herbs, non-prescription drugs, or dietary supplements you use.  Also tell them if you smoke, drink alcohol, or use illegal drugs. Some items may interact with your medicine. What should I watch for while using this medicine? Visit your doctor or health care professional for regular checks on your progress. Your body may become dependent on this medicine, ask your doctor or health care professional if you still need to take it. However, if you have been taking this medicine regularly for some time, do not suddenly stop taking it. You must gradually reduce the dose or you may get severe side effects. Ask your doctor or health care professional for advice before increasing or decreasing the dose. Even after you stop taking this medicine it can still affect your body for several days. You may get drowsy or dizzy. Do not drive, use machinery, or do anything that needs mental alertness until you know how this medicine affects you. To reduce the risk of dizzy and fainting spells, do not stand or sit up quickly, especially if you are an older patient. Alcohol may increase dizziness and drowsiness. Avoid alcoholic drinks. Do not treat yourself for coughs, colds or allergies without asking your doctor or health care professional for advice. Some ingredients can increase possible side effects. What side effects may I notice from receiving this medicine? Side effects that you should report to your doctor or health care professional as soon as possible: -changes in vision -confusion -depression -mood changes, excitability or aggressive behavior -movement difficulty, staggering or jerky movements -muscle cramps -restlessness -weakness or tiredness Side effects that usually do not require medical attention (report to your doctor or health care professional if they continue or are bothersome): -constipation or diarrhea -difficulty sleeping, nightmares -dizziness, drowsiness -headache -nausea, vomiting This list may not describe all possible side effects. Call your doctor  for medical advice about side effects. You may report side effects to FDA at 1-800-FDA-1088. Where should I keep my medicine? Keep out of the reach of children. This medicine can be abused. Keep your medicine in a safe place to protect it from theft. Do not share this medicine with anyone. Selling or giving away this medicine is dangerous and against the law. Store at room temperature between 20 and 25 degrees C (68 and 77 degrees F). Protect from light. Keep container tightly closed. Throw away any unused medicine after the expiration date. NOTE: This sheet is a summary. It may not cover all possible information. If you have questions about this medicine, talk to your doctor, pharmacist, or health care provider.  2014, [employer]. (2007-06-21 14:58:20)

## 2013-02-21 ENCOUNTER — Other Ambulatory Visit (HOSPITAL_COMMUNITY): Payer: Self-pay | Admitting: Family Medicine

## 2013-02-21 DIAGNOSIS — M545 Low back pain, unspecified: Secondary | ICD-10-CM

## 2013-02-28 ENCOUNTER — Other Ambulatory Visit (HOSPITAL_COMMUNITY): Payer: Self-pay | Admitting: Family Medicine

## 2013-02-28 DIAGNOSIS — M5126 Other intervertebral disc displacement, lumbar region: Secondary | ICD-10-CM

## 2013-02-28 DIAGNOSIS — S52512C Displaced fracture of left radial styloid process, initial encounter for open fracture type IIIA, IIIB, or IIIC: Secondary | ICD-10-CM

## 2013-02-28 DIAGNOSIS — M545 Low back pain, unspecified: Secondary | ICD-10-CM

## 2013-03-03 ENCOUNTER — Ambulatory Visit (HOSPITAL_COMMUNITY)
Admission: RE | Admit: 2013-03-03 | Discharge: 2013-03-03 | Disposition: A | Discharge status: Home / Self Care | Payer: 59 | Source: Ambulatory Visit | Attending: Family Medicine | Admitting: Family Medicine

## 2013-03-03 DIAGNOSIS — M545 Low back pain, unspecified: Secondary | ICD-10-CM

## 2013-03-03 DIAGNOSIS — S52512C Displaced fracture of left radial styloid process, initial encounter for open fracture type IIIA, IIIB, or IIIC: Secondary | ICD-10-CM

## 2013-03-03 DIAGNOSIS — M5126 Other intervertebral disc displacement, lumbar region: Secondary | ICD-10-CM | POA: Insufficient documentation

## 2013-03-10 DIAGNOSIS — F419 Anxiety disorder, unspecified: Secondary | ICD-10-CM | POA: Insufficient documentation

## 2013-03-10 DIAGNOSIS — R002 Palpitations: Secondary | ICD-10-CM | POA: Insufficient documentation

## 2013-04-24 ENCOUNTER — Telehealth (HOSPITAL_COMMUNITY): Payer: Self-pay | Admitting: Dietician

## 2013-04-24 NOTE — Telephone Encounter (Signed)
Received referral from [facility] for dx: elevated blood sugar, weight loss.

## 2013-05-09 NOTE — Telephone Encounter (Signed)
No response from previous contact attempt. Sent letter to pt home via [employer] Mail in attempt to contact pt to schedule appointment.  

## 2013-05-14 NOTE — Telephone Encounter (Signed)
Pt has not responded to attempts to contact to schedule appointment. Referral filed.  

## 2013-07-14 ENCOUNTER — Encounter: Payer: 59 | Attending: "Endocrinology | Admitting: Dietician

## 2013-07-14 ENCOUNTER — Encounter: Payer: Self-pay | Admitting: Dietician

## 2013-07-14 VITALS — Ht 68.0 in | Wt 243.8 lb

## 2013-07-14 DIAGNOSIS — Z6837 Body mass index (BMI) 37.0-37.9, adult: Secondary | ICD-10-CM | POA: Diagnosis not present

## 2013-07-14 DIAGNOSIS — E669 Obesity, unspecified: Secondary | ICD-10-CM | POA: Diagnosis not present

## 2013-07-14 DIAGNOSIS — Z713 Dietary counseling and surveillance: Secondary | ICD-10-CM | POA: Diagnosis not present

## 2013-07-14 NOTE — Progress Notes (Signed)
  Medical Nutrition Therapy:  Appt start time: 1115 end time:  1215.   Assessment:  Primary concerns today: Armonii is here today because her doctor has been asking her to lose weight and watch her carbohydrate intake because she is prediabetic. She reports she recently quit smoking and gained 10 lbs. She wants help because she does not know how to lose weight or what foods contain carbohydrates. She also says she has been having anxiety problems and is  being tested for a thyroid condition. 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 She lives with boyfriend at home. She does the food shopping and preparation. They eat out frequently. They like places that offer home cooked meals (ex: [employer]) or sometimes [employer]. She also mentions that she craves  sweet after each meal (nutty butty bar, chocolate). She loves sparkling water (ICE, Skinny) and flavor packets, but has been told no aspartame by her doctor only water. She has arthritis in her back that limits her activity, but she is currently in physical therapy 3 times a week for 1.5 hours a session. She is thinking about starting to walk, but she is concerned about pain from plantar fascitis.   Preferred Learning Style:  No preference indicated   Learning Readiness:   Contemplating  Ready   MEDICATIONS: see list    DIETARY INTAKE:  Usual eating pattern includes 2 meals and 3 snacks per day.  Avoided foods include sweet tea  24-hr recall:  B ( AM): skip   Snk ( AM): potato chips from the vending machine, diet mountain dew   L ( PM): sandwich with ham cheese or tomato sandwich, chips, diet mountain dew, chocolate or nutty butty bar  Snk ( PM): diet mountain dew, bag of chips, rarely an apple or orange D ( PM): Goes out to eat for dinner. Eggs, bacon, hash browns and toast with a pack of jelly or sirloin tips baked potato and a salad with Mr.Pibb and sometimes water  Snk ( PM): ice cream (may eat the whole  pint) Beverages:  Diet mountain dew, regular soda, water, lemonade, sometimes sparkling waters   Usual physical activity: physical therapy 2-3 times a week for about 1.5 hours (limits her activity, back hurts)  Estimated energy needs: 1800 calories 200 g carbohydrates 135 g protein 50 g fat  Progress Towards Goal(s):  In progress.   Nutritional Diagnosis:  [place]-3.3 Overweight/obesity As related to excessive energy intake .  As evidenced by BMI of 37.1 and diet recall.    Intervention:  Nutrition education on weight management and prediabetes.  Teaching Method Utilized:  Visual Auditory  Handouts given during visit include:  Nutrition label  Yellow card   My Plate   Carbohydrate and protein snacks   Barriers to learning/adherence to lifestyle change: arthritis in back, knowledge deficit   Demonstrated degree of understanding via:  Teach Back   Monitoring/Evaluation:  Dietary intake, exercise, diagnosis of prediabetes, and body weight in 2 month(s).

## 2013-07-14 NOTE — Patient Instructions (Addendum)
Avoid skipping breakfast: eat a snack for breakfast (boiled eggs and a banana, or the [employer]) Pack your snacks for work, combine protein with carbohydrate (rice cakes and nuts, fruit and cheese) Drinking water or sparkling water instead of regular soda  Pay attention to your hunger, eat meals slowly and wait 20 minutes before going back for more food to determine if you are really hungry Portion snack foods (chips, ice cream) into smaller bowls, plates or small containers, plastic sandwich bags Keep the My Plate in mind when ordering food at restaurants. Make 1/2 your plate non starchy vegetables (salad, green beans), 1/4 plate protein, 1/4 plate carbohydrates Stretch carbohydrate foods throughout the day. Aim for to 2-3 carb servings per meal and 1-2 servings per snack.  Continue doing your physical therapy exercises. Try going for a walk one time a week.  If you feel the need for something sweet try 1-2 small individually wrapped candies

## 2013-09-19 ENCOUNTER — Ambulatory Visit: Payer: 59 | Admitting: Dietician

## 2013-12-24 ENCOUNTER — Encounter: Payer: Self-pay | Admitting: Nutrition

## 2013-12-24 ENCOUNTER — Encounter: Payer: 59 | Attending: "Endocrinology | Admitting: Nutrition

## 2013-12-24 VITALS — Ht 68.0 in | Wt 238.0 lb

## 2013-12-24 DIAGNOSIS — Z713 Dietary counseling and surveillance: Secondary | ICD-10-CM | POA: Insufficient documentation

## 2013-12-24 DIAGNOSIS — Z6836 Body mass index (BMI) 36.0-36.9, adult: Secondary | ICD-10-CM | POA: Diagnosis not present

## 2013-12-24 DIAGNOSIS — R7309 Other abnormal glucose: Secondary | ICD-10-CM | POA: Diagnosis not present

## 2013-12-24 DIAGNOSIS — R7303 Prediabetes: Secondary | ICD-10-CM

## 2013-12-24 DIAGNOSIS — E669 Obesity, unspecified: Secondary | ICD-10-CM | POA: Insufficient documentation

## 2013-12-24 NOTE — Progress Notes (Signed)
  Medical Nutrition Therapy:  Appt start time: 1200 end time:  1300.   Assessment:  Primary concerns today: Prediabetes. Most recent A1C by Dr. Dorris Fetch was 6.3%. Admits to eating inconsistently. Complains of symptoms of low blood sugars on and off. Quit smoking a year ago. Has done weight watchers before and lost 20-30 lbs but gained it back. Has a lot of stress in her life right now. Not able to work due to severe arthritis. Not able to exercise much either.  She has a glucometer and has checked blood sugars occasionally. Lw blood sugars show BS in the 60's and then sometimes they are in the 150-170's after eating.  Preferred Learning Style:     No preference indicated   Learning Readiness:   Ready  Change in progress   MEDICATIONS: see list   DIETARY INTAKE:  She reports eating sporadically and not on a set schedule. Has been eating sweets at times and then feels like her blood sugars drop at times. Skips breakfast usually. Drinking water now but was drinking tea and sodas before. Craves something sweet after meals.   Usual physical activity: No able to do much ADL's Estimated energy needs: 1500 calories 170 g carbohydrates 112 g protein 42 g fat  Progress Towards Goal(s):  In progress.   Nutritional Diagnosis:  NB-1.1 Food and nutrition-related knowledge deficit As related to Prediabetes.  As evidenced by A1C 6.3%..    Intervention:  Nutrition counseling on diabetes, meal planning, portion sizes, target goals, signs and symptoms of hypoglycemia and treatment and healthy weight loss tips.. Goals:  Follow Diabetes Meal Plan as instructed  Eat 3 meals a day at about the same time  Limit carbohydrate intake to 30-45 grams carbohydrate/meal  Avoid snacks between meals  Add lean protein foods to meals/  Monitor glucose levels as instructed by your doctor  Aim for 15-30  mins of physical activity daily  Bring food record and glucose log to your next nutrition  visit  Get A1C below 6% in 3-6 months. Lose 1 lb per week til next visit.  Increase low carb vegetables to 2 servings per meal.  Cut out sweets, cakes, cookies and sodas and drink only water.  Take Metformin 500 mg BID as prescribed.   Teaching Method Utilized:  Visual Auditory Hands on  Handouts given during visit include:  The Plate Method  The Meal Plan Card  Barriers to learning/adherence to lifestyle change: arthritis  Demonstrated degree of understanding via:  Teach Back   Monitoring/Evaluation:  Dietary intake, exercise, meal planning, SBG, and body weight in 1 month(s).

## 2013-12-24 NOTE — Patient Instructions (Signed)
Goals:  Follow Diabetes Meal Plan as instructed  Eat 3 meals a day at about the same time  Limit carbohydrate intake to 30-45 grams carbohydrate/meal  Avoid snacks between meals  Add lean protein foods to meals/  Monitor glucose levels as instructed by your doctor  Aim for 15-30  mins of physical activity daily  Bring food record and glucose log to your next nutrition visit  Get A1C below 6% in 3-6 months. Lose 1 lb per week til next visit.  Increase low carb vegetables to 2 servings per meal.  Cut out sweets, cakes, cookies and sodas and drink only water.  Take Metformin 500 mg BID as prescribed.

## 2014-01-28 ENCOUNTER — Encounter: Payer: Commercial Managed Care - PPO | Attending: "Endocrinology | Admitting: Nutrition

## 2014-01-28 VITALS — Ht 68.0 in | Wt 236.0 lb

## 2014-01-28 DIAGNOSIS — E669 Obesity, unspecified: Secondary | ICD-10-CM | POA: Diagnosis not present

## 2014-01-28 DIAGNOSIS — Z87891 Personal history of nicotine dependence: Secondary | ICD-10-CM | POA: Diagnosis not present

## 2014-01-28 DIAGNOSIS — R7309 Other abnormal glucose: Secondary | ICD-10-CM | POA: Insufficient documentation

## 2014-01-28 DIAGNOSIS — R7303 Prediabetes: Secondary | ICD-10-CM

## 2014-01-28 DIAGNOSIS — Z6835 Body mass index (BMI) 35.0-35.9, adult: Secondary | ICD-10-CM | POA: Insufficient documentation

## 2014-01-28 DIAGNOSIS — Z713 Dietary counseling and surveillance: Secondary | ICD-10-CM | POA: Insufficient documentation

## 2014-01-28 NOTE — Progress Notes (Signed)
  Medical Nutrition Therapy:  Appt start time: 1200 end time:  1300.   Assessment:  Primary concerns today: Prediabetes.  Has gotten back pain injections. Hasnt changed much with her diet or exercise.. Sees a pyschiatrist for depression and anxiety.Hasn't worked in three months. Has quit smoking over a year about but recently was vaping a few months ago and now is adjusting to quitting that habit. She feels like she has substituting eating more now replacing the nicotine addiction. DIdn't bring log book. Diet is higher in fat, sugar and salt and low in fruit and low carb vegetables. She admits to craving sweets.  Preferred Learning Style:     No preference indicated   Learning Readiness:   Ready  Change in progress   MEDICATIONS: see list   DIETARY INTAKE: B) skipped  L) Bean burrito, [place] and ice cream S) Baked pork chop, with bbq sauce, macaroni, Has quit smoking and vaping now.  Usual physical activity: No able to do much ADL's Estimated energy needs: 1500 calories 170 g carbohydrates 112 g protein 42 g fat  Progress Towards Goal(s):  In progress.   Nutritional Diagnosis:  NB-1.1 Food and nutrition-related knowledge deficit As related to Prediabetes.  As evidenced by A1C 6.3%..    Intervention:  Nutrition counseling on diabetes, meal planning, portion sizes, target goals, signs and symptoms of hypoglycemia and treatment and healthy weight loss tips.Discussed emotional eating and the changes in behavior that need to change to get results. . Goals:  Follow Diabetes Meal Plan as instructed  Eat 3 meals a day at about the same time  Cut out sugar and salty foods.  Eat sugar free jello or fresh fruit for sweet cravings  Avoid canned and boxed foods; especially mac/cheese, scalloped potatoes and ramen noodles.  Limit carbohydrate intake to 30-45 grams carbohydrate/meal  Avoid snacks between meals  Add lean protein foods to meals/  Monitor glucose levels as  instructed by your doctor  Aim for 15-30  mins of physical activity daily  Bring food record and glucose log to your next nutrition visit  Get A1C below 6% in 3-6 months. Lose 1 lb per week til next visit.  Increase low carb vegetables to 2 servings per meal.  Cut out sweets, cakes, cookies and sodas and drink only water.  Take Metformin 500 mg BID as prescribed.   Teaching Method Utilized:  Visual Auditory Hands on  Handouts given during visit include:  The Plate Method  The Meal Plan Card  Barriers to learning/adherence to lifestyle change: arthritis  Demonstrated degree of understanding via:  Teach Back   Monitoring/Evaluation:  Dietary intake, exercise, meal planning, SBG, and body weight in 1 month(s).

## 2014-01-29 DIAGNOSIS — R7303 Prediabetes: Secondary | ICD-10-CM | POA: Insufficient documentation

## 2014-01-29 DIAGNOSIS — E669 Obesity, unspecified: Secondary | ICD-10-CM | POA: Insufficient documentation

## 2014-01-29 NOTE — Patient Instructions (Signed)
Goals:  Follow Diabetes Meal Plan as instructed  Eat 3 meals a day at about the same time  Cut out sugar and salty foods.  Eat sugar free jello or fresh fruit for sweet cravings  Avoid canned and boxed foods; especially mac/cheese, scalloped potatoes and ramen noodles.  Limit carbohydrate intake to 30-45 grams carbohydrate/meal  Avoid snacks between meals  Add lean protein foods to meals/  Monitor glucose levels as instructed by your doctor  Aim for 15-30  mins of physical activity daily  Bring food record and glucose log to your next nutrition visit  Get A1C below 6% in 3-6 months. Lose 1 lb per week til next visit.  Increase low carb vegetables to 2 servings per meal.  Cut out sweets, cakes, cookies and sodas and drink only water.  Take Metformin 500 mg BID as prescribed.

## 2014-03-12 ENCOUNTER — Ambulatory Visit: Payer: Self-pay | Admitting: Nutrition

## 2014-04-03 ENCOUNTER — Other Ambulatory Visit (HOSPITAL_COMMUNITY): Payer: Self-pay | Admitting: Oncology

## 2014-04-03 DIAGNOSIS — Z1231 Encounter for screening mammogram for malignant neoplasm of breast: Secondary | ICD-10-CM

## 2014-04-08 ENCOUNTER — Ambulatory Visit (HOSPITAL_COMMUNITY)
Admission: RE | Admit: 2014-04-08 | Discharge: 2014-04-08 | Disposition: A | Discharge status: Home / Self Care | Payer: Commercial Managed Care - PPO | Source: Ambulatory Visit | Attending: Oncology | Admitting: Oncology

## 2014-04-08 DIAGNOSIS — Z1231 Encounter for screening mammogram for malignant neoplasm of breast: Secondary | ICD-10-CM | POA: Diagnosis not present

## 2014-10-01 ENCOUNTER — Emergency Department (HOSPITAL_COMMUNITY)
Admission: EM | Admit: 2014-10-01 | Discharge: 2014-10-01 | Disposition: A | Discharge status: Home / Self Care | Payer: Self-pay | Attending: Emergency Medicine | Admitting: Emergency Medicine

## 2014-10-01 ENCOUNTER — Emergency Department (HOSPITAL_COMMUNITY): Payer: Self-pay

## 2014-10-01 ENCOUNTER — Encounter (HOSPITAL_COMMUNITY): Payer: Self-pay

## 2014-10-01 DIAGNOSIS — R1031 Right lower quadrant pain: Secondary | ICD-10-CM

## 2014-10-01 DIAGNOSIS — R197 Diarrhea, unspecified: Secondary | ICD-10-CM | POA: Insufficient documentation

## 2014-10-01 DIAGNOSIS — E669 Obesity, unspecified: Secondary | ICD-10-CM | POA: Insufficient documentation

## 2014-10-01 DIAGNOSIS — I10 Essential (primary) hypertension: Secondary | ICD-10-CM | POA: Insufficient documentation

## 2014-10-01 DIAGNOSIS — E119 Type 2 diabetes mellitus without complications: Secondary | ICD-10-CM | POA: Insufficient documentation

## 2014-10-01 DIAGNOSIS — Z8669 Personal history of other diseases of the nervous system and sense organs: Secondary | ICD-10-CM | POA: Insufficient documentation

## 2014-10-01 DIAGNOSIS — Z90710 Acquired absence of both cervix and uterus: Secondary | ICD-10-CM | POA: Insufficient documentation

## 2014-10-01 DIAGNOSIS — Z79899 Other long term (current) drug therapy: Secondary | ICD-10-CM | POA: Insufficient documentation

## 2014-10-01 DIAGNOSIS — Z87891 Personal history of nicotine dependence: Secondary | ICD-10-CM | POA: Insufficient documentation

## 2014-10-01 DIAGNOSIS — N200 Calculus of kidney: Secondary | ICD-10-CM | POA: Insufficient documentation

## 2014-10-01 HISTORY — DX: Type 2 diabetes mellitus without complications: E11.9

## 2014-10-01 LAB — COMPREHENSIVE METABOLIC PANEL
ALBUMIN: 4.4 g/dL (ref 3.5–5.0)
ALK PHOS: 89 U/L (ref 38–126)
ALT: 49 U/L (ref 14–54)
AST: 40 U/L (ref 15–41)
Anion gap: 8 (ref 5–15)
BUN: 15 mg/dL (ref 6–20)
CALCIUM: 9.2 mg/dL (ref 8.9–10.3)
CHLORIDE: 106 mmol/L (ref 101–111)
CO2: 25 mmol/L (ref 22–32)
CREATININE: 0.88 mg/dL (ref 0.44–1.00)
GFR calc non Af Amer: >60 mL/min (ref >60)
GLUCOSE: 121 mg/dL — AB (ref 65–99)
Potassium: 3.7 mmol/L (ref 3.5–5.1)
SODIUM: 139 mmol/L (ref 135–145)
Total Bilirubin: 0.9 mg/dL (ref 0.3–1.2)
Total Protein: 8 g/dL (ref 6.5–8.1)

## 2014-10-01 LAB — LIPASE, BLOOD: Lipase: 13 U/L — ABNORMAL LOW (ref 22–51)

## 2014-10-01 LAB — URINALYSIS, ROUTINE W REFLEX MICROSCOPIC
Bilirubin Urine: NEGATIVE
Glucose, UA: NEGATIVE mg/dL
Ketones, ur: NEGATIVE mg/dL
Leukocytes, UA: NEGATIVE
Nitrite: NEGATIVE
PH: 6 (ref 5.0–8.0)
Protein, ur: NEGATIVE mg/dL
SPECIFIC GRAVITY, URINE: 1.01 (ref 1.005–1.030)
UROBILINOGEN UA: 0.2 mg/dL (ref 0.0–1.0)

## 2014-10-01 LAB — CBC WITH DIFFERENTIAL/PLATELET
BASOS PCT: 0 %
Basophils Absolute: 0 10*3/uL (ref 0.0–0.1)
EOS ABS: 0.1 10*3/uL (ref 0.0–0.7)
Eosinophils Relative: 1 %
HCT: 44.1 % (ref 36.0–46.0)
Hemoglobin: 15.4 g/dL — ABNORMAL HIGH (ref 12.0–15.0)
LYMPHS ABS: 2.5 10*3/uL (ref 0.7–4.0)
Lymphocytes Relative: 24 %
MCH: 30.9 pg (ref 26.0–34.0)
MCHC: 34.9 g/dL (ref 30.0–36.0)
MCV: 88.4 fL (ref 78.0–100.0)
Monocytes Absolute: 1 10*3/uL (ref 0.1–1.0)
Monocytes Relative: 9 %
Neutro Abs: 6.9 10*3/uL (ref 1.7–7.7)
Neutrophils Relative %: 66 %
PLATELETS: 257 10*3/uL (ref 150–400)
RBC: 4.99 MIL/uL (ref 3.87–5.11)
RDW: 13.4 % (ref 11.5–15.5)
WBC: 10.6 10*3/uL — AB (ref 4.0–10.5)

## 2014-10-01 LAB — URINE MICROSCOPIC-ADD ON

## 2014-10-01 MED ORDER — SODIUM CHLORIDE 0.9 % IV BOLUS (SEPSIS)
1000.0000 mL | Freq: Once | INTRAVENOUS | Status: AC
  Administered 2014-10-01: 1000 mL via INTRAVENOUS

## 2014-10-01 MED ORDER — HYDROMORPHONE HCL 1 MG/ML IJ SOLN
1.0000 mg | Freq: Once | INTRAMUSCULAR | Status: AC
  Administered 2014-10-01: 1 mg via INTRAVENOUS
  Filled 2014-10-01: qty 1

## 2014-10-01 MED ORDER — IOHEXOL 300 MG/ML  SOLN
50.0000 mL | Freq: Once | INTRAMUSCULAR | Status: AC | PRN
  Administered 2014-10-01: 50 mL via ORAL

## 2014-10-01 MED ORDER — FENTANYL CITRATE (PF) 100 MCG/2ML IJ SOLN
50.0000 ug | Freq: Once | INTRAMUSCULAR | Status: AC
Start: 2014-10-01 — End: 2014-10-01
  Administered 2014-10-01: 50 ug via INTRAVENOUS
  Filled 2014-10-01: qty 2

## 2014-10-01 MED ORDER — IOHEXOL 300 MG/ML  SOLN
100.0000 mL | Freq: Once | INTRAMUSCULAR | Status: AC | PRN
  Administered 2014-10-01: 100 mL via INTRAVENOUS

## 2014-10-01 MED ORDER — ONDANSETRON HCL 4 MG/2ML IJ SOLN
4.0000 mg | Freq: Once | INTRAMUSCULAR | Status: AC
  Administered 2014-10-01: 4 mg via INTRAVENOUS
  Filled 2014-10-01: qty 2

## 2014-10-01 MED ORDER — TRAMADOL HCL 50 MG PO TABS: 50.0000 mg | ORAL_TABLET | Freq: Four times a day (QID) | ORAL | Status: DC | PRN

## 2014-10-01 NOTE — ED Notes (Signed)
MD at bedside. 

## 2014-10-01 NOTE — ED Notes (Signed)
Patient actively vomiting during zofran administration.

## 2014-10-01 NOTE — ED Notes (Signed)
Pt reports pain in ruq and r side area 2 days ago.  Reports started having n/v/d yesterday.  Today pain worse.   No vomiting today but has had diarrhea x3.

## 2014-10-01 NOTE — Discharge Instructions (Signed)
As discussed, your evaluation today has been largely reassuring.  But, it is important that you monitor your condition carefully, and do not hesitate to return to the ED if you develop new, or concerning changes in your condition.  Otherwise, please follow-up with your physician for appropriate ongoing care.  It is important that you follow-up with your physician and our urologist for further evaluation.

## 2014-10-01 NOTE — ED Provider Notes (Signed)
CSN: [id number]     Arrival date & time 10/01/14  1206 History  By signing my name below, I, Kimberly Marks, attest that this documentation has been prepared under the direction and in the presence of [name], MD. Electronically Signed: Erling Marks, ED Scribe. 10/01/2014. 12:50 PM.    Chief Complaint  Patient presents with  . Abdominal Pain    The history is provided by the patient. No language interpreter was used.    HPI Comments: Kimberly Marks is a 44 y.o. female with a h/o HTN and DM who presents to the Emergency Department complaining of constant, mild, gradually worsening, RUQ abdominal pain onset 3 days. She reports associated nausea, vomiting, diarrhea, chills and subjective fever. She states the vomiting has resolved today but has had diarrhea x3. Pt notes she has a h/o right sided abdominal pain in the past with h/o abdominal surgery. Pt endorses sick contact from her boyfriend who has similar symptoms. She still has her gallbladder. She denies any urinary symptoms.      Past Medical History  Diagnosis Date  . Hypertension   . Sleep apnea   . Obesity   . Diabetes mellitus without complication    Past Surgical History  Procedure Laterality Date  . Tonsillectomy    . Abdominal hysterectomy    . Abdominal surgery     Family History  Problem Relation Age of Onset  . Asthma Other   . Cancer Other   . COPD Other   . Hypertension Other   . Hyperlipidemia Other   . Stroke Other   . Heart disease Other   . Diabetes Other    Social History  Substance Use Topics  . Smoking status: Former Smoker -- 1.00 packs/day  . Smokeless tobacco: Former Systems developer    Quit date: 11/24/2012  . Alcohol Use: 0.0 oz/week    0 Standard drinks or equivalent per week     Comment: occasionally   OB History    No data available     Review of Systems  Constitutional: Positive for fever (subjective) and chills.  Gastrointestinal: Positive for nausea, vomiting, abdominal pain and  diarrhea.  Genitourinary: Negative for dysuria, urgency, frequency, hematuria and difficulty urinating.      Allergies  Review of patient's allergies indicates no known allergies.  Home Medications   Prior to Admission medications   Medication Sig Start Date End Date Taking? Authorizing Provider  ALPRAZolam Duanne Moron) 0.5 MG tablet Take 0.5 mg by mouth 3 (three) times daily as needed for anxiety.    Yes Historical Provider, MD  bisoprolol-hydrochlorothiazide (ZIAC) 2.5-6.25 MG per tablet Take 1 tablet by mouth at bedtime.    Yes Historical Provider, MD  metFORMIN (GLUCOPHAGE) 500 MG tablet Take 500 mg by mouth 2 (two) times daily with a meal.   Yes Historical Provider, MD  sertraline (ZOLOFT) 100 MG tablet Take 200 mg by mouth daily.   Yes Historical Provider, MD   Triage Vitals: BP 133/97 mmHg  Pulse 90  Temp(Src) 99.5 F (37.5 C) (Oral)  Resp 20  Ht 5\' 8"  (1.727 m)  Wt 228 lb (103.42 kg)  BMI 34.68 kg/m2  SpO2 97%  Physical Exam  Constitutional: She is oriented to person, place, and time. She appears well-developed and well-nourished. No distress.  HENT:  Head: Normocephalic and atraumatic.  Eyes: Conjunctivae and EOM are normal.  Neck: Neck supple. No tracheal deviation present.  Cardiovascular: Normal rate and normal heart sounds.   Pulmonary/Chest: Effort normal and  breath sounds normal. No respiratory distress. She has no wheezes. She has no rales.  Abdominal: Soft. Normal appearance. There is tenderness (mild with TTP of right lateral abdomen). There is no rebound and no guarding.  Musculoskeletal: Normal range of motion.  Neurological: She is alert and oriented to person, place, and time.  Skin: Skin is warm and dry.  Psychiatric: She has a normal mood and affect. Her behavior is normal.  Nursing note and vitals reviewed.   ED Course  Procedures (including critical care time)  DIAGNOSTIC STUDIES: Oxygen Saturation is 97% on RA, normal by my interpretation.     COORDINATION OF CARE: 12:57 PM- Will order CMP, UA and CBC w/ diff.  Pt advised of plan for treatment and pt agrees.   Labs Review Labs Reviewed  CBC WITH DIFFERENTIAL/PLATELET - Abnormal; Notable for the following:    WBC 10.6 (*)    Hemoglobin 15.4 (*)    All other components within normal limits  COMPREHENSIVE METABOLIC PANEL - Abnormal; Notable for the following:    Glucose, Bld 121 (*)    All other components within normal limits  URINALYSIS, ROUTINE W REFLEX MICROSCOPIC (NOT AT [facility]) - Abnormal; Notable for the following:    Hgb urine dipstick LARGE (*)    All other components within normal limits  LIPASE, BLOOD - Abnormal; Notable for the following:    Lipase 13 (*)    All other components within normal limits  URINE MICROSCOPIC-ADD ON    Imaging Review Ct Abdomen Pelvis W Contrast  10/01/2014   CLINICAL DATA:  Right lower quadrant pain and diarrhea for 3 days.  EXAM: CT ABDOMEN AND PELVIS WITH CONTRAST  TECHNIQUE: Multidetector CT imaging of the abdomen and pelvis was performed using the standard protocol following bolus administration of intravenous contrast.  CONTRAST:  78mL OMNIPAQUE IOHEXOL 300 MG/ML SOLN, 166mL OMNIPAQUE IOHEXOL 300 MG/ML SOLN  COMPARISON:  February 26, 2012  FINDINGS: There is diffuse low density of liver without vessel displacement. No focal liver lesion is identified. The spleen, pancreas, gallbladder, adrenal glands are normal. There is a 7 mm cyst in the midpole left kidney. There is dilatation of the right renal pelvis with a nonobstructive 7.6 mm Beilfuss in the renal pelvis. The bilateral ureters are normal. The aorta is normal. There is no abdominal lymphadenopathy.  There is no small bowel obstruction or diverticulitis. The appendix is normal.  Partial fluid-filled bladder is normal. Pelvic phleboliths are identified unchanged. The lung bases are clear. No acute abnormalities identified within the visualized bones.  IMPRESSION: Dilatation of the right  renal pelvis with a nonobstructed 7.6 mm [place] in the dependent portion of the right renal pelvis. The bilateral ureters are normal.  Normal appendix.   Electronically Signed   By: Abelardo Diesel M.D.   On: 10/01/2014 16:14   I have personally reviewed and evaluated these images and lab results as part of my medical decision-making. Patient substantially better on repeat evaluation, no ongoing complaints, she is eating crackers, sitting upright, drinking fluids MDM   Final diagnoses:  Right lower quadrant abdominal pain  Nephrolithiasis  Patient presents with ongoing pain. Here the patient's evaluation is largely reassuring, with no evidence for peritonitis, no acute appendicitis, no obstructed or infected kidney Ammirati. Patient presents substantially with fluids, symptomatic medication, was tolerant of oral intake Patient was afebrile, hemodynamically stable, will follow up with outpatient providers.   I personally performed the services described in this documentation, which was scribed in my presence.  The recorded information has been reviewed and is accurate.       [name], MD 10/01/14 2240

## 2014-10-01 NOTE — ED Notes (Signed)
Patient transported to CT 

## 2014-10-08 ENCOUNTER — Other Ambulatory Visit: Payer: Self-pay | Admitting: Urology

## 2014-10-08 ENCOUNTER — Encounter (HOSPITAL_COMMUNITY): Payer: Self-pay | Admitting: *Deleted

## 2014-10-15 ENCOUNTER — Emergency Department (HOSPITAL_COMMUNITY): Payer: Self-pay

## 2014-10-15 ENCOUNTER — Ambulatory Visit (HOSPITAL_COMMUNITY): Payer: Self-pay

## 2014-10-15 ENCOUNTER — Ambulatory Visit (HOSPITAL_COMMUNITY)
Admission: RE | Admit: 2014-10-15 | Discharge: 2014-10-15 | Disposition: A | Discharge status: Home / Self Care | Payer: Self-pay | Source: Ambulatory Visit | Attending: Urology | Admitting: Urology

## 2014-10-15 ENCOUNTER — Emergency Department (HOSPITAL_COMMUNITY)
Admission: EM | Admit: 2014-10-15 | Discharge: 2014-10-16 | Disposition: A | Discharge status: Home / Self Care | Payer: Self-pay | Attending: Emergency Medicine | Admitting: Emergency Medicine

## 2014-10-15 ENCOUNTER — Encounter (HOSPITAL_COMMUNITY): Payer: Self-pay | Admitting: *Deleted

## 2014-10-15 ENCOUNTER — Encounter (HOSPITAL_COMMUNITY)
Admission: RE | Disposition: A | Discharge status: Home / Self Care | Payer: Self-pay | Source: Ambulatory Visit | Attending: Urology

## 2014-10-15 DIAGNOSIS — E119 Type 2 diabetes mellitus without complications: Secondary | ICD-10-CM | POA: Insufficient documentation

## 2014-10-15 DIAGNOSIS — Z90710 Acquired absence of both cervix and uterus: Secondary | ICD-10-CM | POA: Insufficient documentation

## 2014-10-15 DIAGNOSIS — M1990 Unspecified osteoarthritis, unspecified site: Secondary | ICD-10-CM | POA: Insufficient documentation

## 2014-10-15 DIAGNOSIS — Z79899 Other long term (current) drug therapy: Secondary | ICD-10-CM | POA: Insufficient documentation

## 2014-10-15 DIAGNOSIS — G4730 Sleep apnea, unspecified: Secondary | ICD-10-CM | POA: Insufficient documentation

## 2014-10-15 DIAGNOSIS — N200 Calculus of kidney: Secondary | ICD-10-CM | POA: Insufficient documentation

## 2014-10-15 DIAGNOSIS — I10 Essential (primary) hypertension: Secondary | ICD-10-CM | POA: Insufficient documentation

## 2014-10-15 DIAGNOSIS — R52 Pain, unspecified: Secondary | ICD-10-CM

## 2014-10-15 DIAGNOSIS — E669 Obesity, unspecified: Secondary | ICD-10-CM | POA: Insufficient documentation

## 2014-10-15 DIAGNOSIS — Z791 Long term (current) use of non-steroidal anti-inflammatories (NSAID): Secondary | ICD-10-CM | POA: Insufficient documentation

## 2014-10-15 DIAGNOSIS — Z8669 Personal history of other diseases of the nervous system and sense organs: Secondary | ICD-10-CM | POA: Insufficient documentation

## 2014-10-15 DIAGNOSIS — Z87891 Personal history of nicotine dependence: Secondary | ICD-10-CM | POA: Insufficient documentation

## 2014-10-15 DIAGNOSIS — Z9889 Other specified postprocedural states: Secondary | ICD-10-CM | POA: Insufficient documentation

## 2014-10-15 HISTORY — DX: Unspecified osteoarthritis, unspecified site: M19.90

## 2014-10-15 LAB — I-STAT CHEM 8, ED
BUN: 10 mg/dL (ref 6–20)
CALCIUM ION: 1.17 mmol/L (ref 1.12–1.23)
CHLORIDE: 105 mmol/L (ref 101–111)
CREATININE: 0.9 mg/dL (ref 0.44–1.00)
GLUCOSE: 133 mg/dL — AB (ref 65–99)
HCT: 39 % (ref 36.0–46.0)
Hemoglobin: 13.3 g/dL (ref 12.0–15.0)
Potassium: 4.1 mmol/L (ref 3.5–5.1)
Sodium: 141 mmol/L (ref 135–145)
TCO2: 22 mmol/L (ref 0–100)

## 2014-10-15 LAB — URINALYSIS, ROUTINE W REFLEX MICROSCOPIC
GLUCOSE, UA: NEGATIVE mg/dL
Leukocytes, UA: NEGATIVE
Nitrite: POSITIVE — AB
PH: 6.5 (ref 5.0–8.0)
Protein, ur: >300 mg/dL — AB
Urobilinogen, UA: 1 mg/dL (ref 0.0–1.0)

## 2014-10-15 LAB — CBC WITH DIFFERENTIAL/PLATELET
BASOS PCT: 0 %
Basophils Absolute: 0 10*3/uL (ref 0.0–0.1)
EOS ABS: 0.1 10*3/uL (ref 0.0–0.7)
Eosinophils Relative: 1 %
HEMATOCRIT: 40 % (ref 36.0–46.0)
HEMOGLOBIN: 13.6 g/dL (ref 12.0–15.0)
LYMPHS ABS: 2.9 10*3/uL (ref 0.7–4.0)
Lymphocytes Relative: 23 %
MCH: 30.6 pg (ref 26.0–34.0)
MCHC: 34 g/dL (ref 30.0–36.0)
MCV: 89.9 fL (ref 78.0–100.0)
MONO ABS: 0.8 10*3/uL (ref 0.1–1.0)
MONOS PCT: 7 %
NEUTROS ABS: 8.7 10*3/uL — AB (ref 1.7–7.7)
NEUTROS PCT: 69 %
Platelets: 230 10*3/uL (ref 150–400)
RBC: 4.45 MIL/uL (ref 3.87–5.11)
RDW: 13.5 % (ref 11.5–15.5)
WBC: 12.5 10*3/uL — ABNORMAL HIGH (ref 4.0–10.5)

## 2014-10-15 LAB — URINE MICROSCOPIC-ADD ON

## 2014-10-15 LAB — BASIC METABOLIC PANEL
Anion gap: 6 (ref 5–15)
BUN: 11 mg/dL (ref 6–20)
CALCIUM: 8.9 mg/dL (ref 8.9–10.3)
CHLORIDE: 109 mmol/L (ref 101–111)
CO2: 24 mmol/L (ref 22–32)
CREATININE: 0.93 mg/dL (ref 0.44–1.00)
GFR calc Af Amer: >60 mL/min (ref >60)
GFR calc non Af Amer: >60 mL/min (ref >60)
Glucose, Bld: 133 mg/dL — ABNORMAL HIGH (ref 65–99)
Potassium: 4 mmol/L (ref 3.5–5.1)
Sodium: 139 mmol/L (ref 135–145)

## 2014-10-15 LAB — GLUCOSE, CAPILLARY: Glucose-Capillary: 129 mg/dL — ABNORMAL HIGH (ref 65–99)

## 2014-10-15 SURGERY — LITHOTRIPSY, ESWL
Anesthesia: LOCAL | Laterality: Right

## 2014-10-15 MED ORDER — CIPROFLOXACIN HCL 500 MG PO TABS
500.0000 mg | ORAL_TABLET | ORAL | Status: AC
  Administered 2014-10-15: 500 mg via ORAL
  Filled 2014-10-15: qty 1

## 2014-10-15 MED ORDER — HYDROMORPHONE HCL 2 MG/ML IJ SOLN
2.0000 mg | Freq: Once | INTRAMUSCULAR | Status: AC
  Administered 2014-10-15: 2 mg via INTRAVENOUS
  Filled 2014-10-15: qty 1

## 2014-10-15 MED ORDER — ONDANSETRON HCL 4 MG/2ML IJ SOLN
4.0000 mg | Freq: Once | INTRAMUSCULAR | Status: AC
  Administered 2014-10-15: 4 mg via INTRAVENOUS
  Filled 2014-10-15: qty 2

## 2014-10-15 MED ORDER — CIPROFLOXACIN HCL 500 MG PO TABS: 500.0000 mg | ORAL_TABLET | Freq: Two times a day (BID) | ORAL | Status: DC

## 2014-10-15 MED ORDER — HYDROMORPHONE HCL 1 MG/ML IJ SOLN
1.0000 mg | Freq: Once | INTRAMUSCULAR | Status: AC
  Administered 2014-10-15: 1 mg via INTRAVENOUS
  Filled 2014-10-15: qty 1

## 2014-10-15 MED ORDER — HYDROMORPHONE HCL 4 MG PO TABS: 4.0000 mg | ORAL_TABLET | ORAL | Status: DC | PRN

## 2014-10-15 MED ORDER — DIAZEPAM 5 MG PO TABS
10.0000 mg | ORAL_TABLET | ORAL | Status: AC
  Administered 2014-10-15: 10 mg via ORAL
  Filled 2014-10-15: qty 2

## 2014-10-15 MED ORDER — SODIUM CHLORIDE 0.9 % IV SOLN
INTRAVENOUS | Status: DC
  Administered 2014-10-15: 1000 mL via INTRAVENOUS

## 2014-10-15 MED ORDER — DIPHENHYDRAMINE HCL 25 MG PO CAPS
25.0000 mg | ORAL_CAPSULE | ORAL | Status: AC
  Administered 2014-10-15: 25 mg via ORAL
  Filled 2014-10-15: qty 1

## 2014-10-15 MED ORDER — LORAZEPAM 2 MG/ML IJ SOLN
1.0000 mg | Freq: Once | INTRAMUSCULAR | Status: AC
  Administered 2014-10-15: 1 mg via INTRAVENOUS
  Filled 2014-10-15: qty 1

## 2014-10-15 MED ORDER — SODIUM CHLORIDE 0.9 % IV BOLUS (SEPSIS)
500.0000 mL | Freq: Once | INTRAVENOUS | Status: AC
  Administered 2014-10-15: 500 mL via INTRAVENOUS

## 2014-10-15 NOTE — Discharge Instructions (Signed)
Call your urologist tomorrow and give him an update

## 2014-10-15 NOTE — ED Notes (Signed)
Pt states lithotripsy was done at 1100 today. States severe pain. States she took a hydrocodone 1 hour PTA and it did not help.

## 2014-10-15 NOTE — ED Provider Notes (Signed)
CSN: [id number]     Arrival date & time 10/15/14  5093 History  By signing my name below, I, Kimberly Marks, attest that this documentation has been prepared under the direction and in the presence of Kimberly Ferguson, MD. Electronically Signed: Helane Marks, ED Scribe. 10/15/2014. 8:24 PM.    Chief Complaint  Patient presents with  . Pain   Patient is a 44 y.o. female presenting with abdominal pain. The history is provided by the patient. No language interpreter was used.  Abdominal Pain Pain location:  RLQ Pain radiates to:  Does not radiate Pain severity:  Moderate Onset quality:  Gradual Timing:  Constant Progression:  Worsening Chronicity:  New Relieved by:  Nothing Associated symptoms: nausea and vomiting   Associated symptoms: no chest pain, no cough, no diarrhea, no fatigue and no hematuria    HPI Comments: Kimberly Marks is a 44 y.o. female who presents to the Emergency Department complaining of RLQ abdominal pain onset just after having a lithotripsy at 11 AM today. She reports associated nausea, vomiting, urgency, and decrease in urination.   Past Medical History  Diagnosis Date  . Hypertension   . Sleep apnea   . Obesity   . Diabetes mellitus without complication ([place])   . Arthritis    Past Surgical History  Procedure Laterality Date  . Tonsillectomy    . Abdominal hysterectomy    . Abdominal surgery    . Lithotripsy     Family History  Problem Relation Age of Onset  . Asthma Other   . Cancer Other   . COPD Other   . Hypertension Other   . Hyperlipidemia Other   . Stroke Other   . Heart disease Other   . Diabetes Other    Social History  Substance Use Topics  . Smoking status: Former Smoker -- 1.00 packs/day  . Smokeless tobacco: Former Systems developer    Quit date: 11/24/2012  . Alcohol Use: 0.0 oz/week    0 Standard drinks or equivalent per week     Comment: occasionally   OB History    No data available     Review of Systems  Constitutional:  Negative for appetite change and fatigue.  HENT: Negative for congestion, ear discharge and sinus pressure.   Eyes: Negative for discharge.  Respiratory: Negative for cough.   Cardiovascular: Negative for chest pain.  Gastrointestinal: Positive for nausea, vomiting and abdominal pain. Negative for diarrhea.  Genitourinary: Positive for urgency and decreased urine volume. Negative for frequency and hematuria.  Musculoskeletal: Negative for back pain.  Skin: Negative for rash.  Neurological: Negative for seizures and headaches.  Psychiatric/Behavioral: Negative for hallucinations.      Allergies  Review of patient's allergies indicates no known allergies.  Home Medications   Prior to Admission medications   Medication Sig Start Date End Date Taking? Authorizing Provider  acetaminophen (TYLENOL) 500 MG tablet Take 500-1,000 mg by mouth every 6 (six) hours as needed for moderate pain or headache.    Historical Provider, MD  albuterol (PROVENTIL HFA;VENTOLIN HFA) 108 (90 BASE) MCG/ACT inhaler Inhale 1-2 puffs into the lungs every 6 (six) hours as needed for wheezing or shortness of breath.    Historical Provider, MD  ALPRAZolam Duanne Moron) 0.5 MG tablet Take 0.5 mg by mouth 3 (three) times daily as needed for anxiety.     Historical Provider, MD  bisoprolol-hydrochlorothiazide (ZIAC) 2.5-6.25 MG per tablet Take 1 tablet by mouth at bedtime.     Historical Provider, MD  furosemide (LASIX) 20 MG tablet Take 20 mg by mouth daily.    Historical Provider, MD  HYDROcodone-acetaminophen (NORCO/VICODIN) 5-325 MG tablet Take 1 tablet by mouth every 6 (six) hours as needed for moderate pain.    Historical Provider, MD  meloxicam (MOBIC) 7.5 MG tablet Take 7.5 mg by mouth 2 (two) times daily.    Historical Provider, MD  metFORMIN (GLUCOPHAGE) 500 MG tablet Take 500 mg by mouth 2 (two) times daily with a meal.    Historical Provider, MD  promethazine (PHENERGAN) 25 MG tablet Take 25 mg by mouth every 6  (six) hours as needed for nausea or vomiting.    Historical Provider, MD  sertraline (ZOLOFT) 100 MG tablet Take 200 mg by mouth at bedtime.     Historical Provider, MD  traMADol (ULTRAM) 50 MG tablet Take 1 tablet (50 mg total) by mouth every 6 (six) hours as needed. Patient taking differently: Take 50 mg by mouth every 6 (six) hours as needed for moderate pain.  10/01/14   [name], MD   BP 113/79 mmHg  Pulse 79  Temp(Src) 98.2 F (36.8 C) (Oral)  Resp 20  Ht 5\' 8"  (1.727 m)  Wt 235 lb (106.595 kg)  BMI 35.74 kg/m2  SpO2 98% Physical Exam  Constitutional: She is oriented to person, place, and time. She appears well-developed. She appears distressed.  Moderate distress  HENT:  Head: Normocephalic.  Eyes: Conjunctivae and EOM are normal. No scleral icterus.  Neck: Neck supple. No thyromegaly present.  Cardiovascular: Normal rate and regular rhythm.  Exam reveals no gallop and no friction rub.   No murmur heard. Pulmonary/Chest: No stridor. She has no wheezes. She has no rales. She exhibits no tenderness.  Abdominal: She exhibits no distension. There is tenderness. There is no rebound.  moderate RLQ and R flank TTP  Musculoskeletal: Normal range of motion. She exhibits no edema.  Lymphadenopathy:    She has no cervical adenopathy.  Neurological: She is oriented to person, place, and time. She exhibits normal muscle tone. Coordination normal.  Skin: No rash noted. No erythema.  Psychiatric: She has a normal mood and affect. Her behavior is normal.    ED Course  Procedures  DIAGNOSTIC STUDIES: Oxygen Saturation is 98% on RA, normal by my interpretation.    COORDINATION OF CARE: 8:11 PM - Discussed plans to make pt more comfortable. Pt advised of plan for treatment and pt agrees.  Labs Review Labs Reviewed - No data to display  Imaging Review Dg Abd 1 View  10/15/2014  CLINICAL DATA:  Preoperative evaluation for Bankert removal. EXAM: ABDOMEN - 1 VIEW COMPARISON:   Abdominal radiograph 10/05/2014; CT 10/01/2014 FINDINGS: 8 mm [place] persists and projects over the right renal hilum. No additional nephrolithiasis identified. Nonobstructed bowel gas pattern. Osseous skeleton unremarkable. IMPRESSION: Unchanged 8 mm [place] projecting over the right renal hilum. Electronically Signed   By: Lovey Newcomer M.D.   On: 10/15/2014 09:38   I have personally reviewed and evaluated these images and lab results as part of my medical decision-making.   EKG Interpretation None      MDM   Final diagnoses:  None    CT scan shows multiple small kidney stones. Prior to getting the CT scan spoke with the urologist who stated that the CT scan only showed kidney stones the patient could follow-up as an outpatient. Urine shows some bacteria we will culture the urine start her on Cipro will change pain medicine to Dilaudid. Patient  will call his urologist tomorrow for follow-up  The chart was scribed for me under my direct supervision.  I personally performed the history, physical, and medical decision making and all procedures in the evaluation of this patient.Kimberly Ferguson, MD 10/15/14 2337

## 2014-10-15 NOTE — Discharge Instructions (Signed)
Kidney Stones °Kidney stones (urolithiasis) are deposits that form inside your kidneys. The intense pain is caused by the Monciv[place] moving through the urinary tract. When the Wunschel moves, the ureter goes into spasm around the Wimberly. The Taber is usually passed in the urine.  °CAUSES  °· A disorder that makes certain neck glands produce too much parathyroid hormone (primary hyperparathyroidism). °· A buildup of uric acid crystals, similar to gout in your joints. °· Narrowing (stricture) of the ureter. °· A kidney obstruction present at birth (congenital obstruction). °· Previous surgery on the kidney or ureters. °· Numerous kidney infections. °SYMPTOMS  °· Feeling sick to your stomach (nauseous). °· Throwing up (vomiting). °· Blood in the urine (hematuria). °· Pain that usually spreads (radiates) to the groin. °· Frequency or urgency of urination. °DIAGNOSIS  °· Taking a history and physical exam. °· Blood or urine tests. °· CT scan. °· Occasionally, an examination of the inside of the urinary bladder (cystoscopy) is performed. °TREATMENT  °· Observation. °· Increasing your fluid intake. °· Extracorporeal shock wave lithotripsy--This is a noninvasive procedure that uses shock waves to break up kidney stones. °· Surgery may be needed if you have severe pain or persistent obstruction. There are various surgical procedures. Most of the procedures are performed with the use of small instruments. Only small incisions are needed to accommodate these instruments, so recovery time is minimized. °The size, location, and chemical composition are all important variables that will determine the proper choice of action for you. Talk to your health care provider to better understand your situation so that you will minimize the risk of injury to yourself and your kidney.  °HOME CARE INSTRUCTIONS  °· Drink enough water and fluids to keep your urine clear or pale yellow. This will help you to pass the Lata or Pousson fragments. °· Strain  all urine through the provided strainer. Keep all particulate matter and stones for your health care provider to see. The Virag causing the pain may be as small as a grain of salt. It is very important to use the strainer each and every time you pass your urine. The collection of your Borrayo will allow your health care provider to analyze it and verify that a Teschner has actually passed. The Geisel analysis will often identify what you can do to reduce the incidence of recurrences. °· Only take over-the-counter or prescription medicines for pain, discomfort, or fever as directed by your health care provider. °· Keep all follow-up visits as told by your health care provider. This is important. °· Get follow-up X-rays if required. The absence of pain does not always mean that the Schult has passed. It may have only stopped moving. If the urine remains completely obstructed, it can cause loss of kidney function or even complete destruction of the kidney. It is your responsibility to make sure X-rays and follow-ups are completed. Ultrasounds of the kidney can show blockages and the status of the kidney. Ultrasounds are not associated with any radiation and can be performed easily in a matter of minutes. °· Make changes to your daily diet as told by your health care provider. You may be told to: °¨ Limit the amount of salt that you eat. °¨ Eat 5 or more servings of fruits and vegetables each day. °¨ Limit the amount of meat, poultry, fish, and eggs that you eat. °· Collect a 24-hour urine sample as told by your health care provider. You may need to collect another urine sample every 6-12   months. °SEEK MEDICAL CARE IF: °· You experience pain that is progressive and unresponsive to any pain medicine you have been prescribed. °SEEK IMMEDIATE MEDICAL CARE IF:  °· Pain cannot be controlled with the prescribed medicine. °· You have a fever or shaking chills. °· The severity or intensity of pain increases over 18 hours and is not  relieved by pain medicine. °· You develop a new onset of abdominal pain. °· You feel faint or pass out. °· You are unable to urinate. °  °This information is not intended to replace advice given to you by your health care provider. Make sure you discuss any questions you have with your health care provider. °  °Document Released: 12/19/2004 Document Revised: 09/09/2014 Document Reviewed: 05/22/2012 °Elsevier Interactive Patient Education ©2016 [employer] ° °

## 2014-10-16 ENCOUNTER — Encounter (HOSPITAL_COMMUNITY): Payer: Self-pay

## 2014-10-16 ENCOUNTER — Emergency Department (HOSPITAL_COMMUNITY)
Admission: EM | Admit: 2014-10-16 | Discharge: 2014-10-16 | Disposition: A | Discharge status: Home / Self Care | Payer: Self-pay | Attending: Emergency Medicine | Admitting: Emergency Medicine

## 2014-10-16 ENCOUNTER — Emergency Department (HOSPITAL_COMMUNITY): Payer: Self-pay

## 2014-10-16 DIAGNOSIS — Z791 Long term (current) use of non-steroidal anti-inflammatories (NSAID): Secondary | ICD-10-CM | POA: Insufficient documentation

## 2014-10-16 DIAGNOSIS — Z8669 Personal history of other diseases of the nervous system and sense organs: Secondary | ICD-10-CM | POA: Insufficient documentation

## 2014-10-16 DIAGNOSIS — I10 Essential (primary) hypertension: Secondary | ICD-10-CM | POA: Insufficient documentation

## 2014-10-16 DIAGNOSIS — Z79899 Other long term (current) drug therapy: Secondary | ICD-10-CM | POA: Insufficient documentation

## 2014-10-16 DIAGNOSIS — Z87891 Personal history of nicotine dependence: Secondary | ICD-10-CM | POA: Insufficient documentation

## 2014-10-16 DIAGNOSIS — E669 Obesity, unspecified: Secondary | ICD-10-CM | POA: Insufficient documentation

## 2014-10-16 DIAGNOSIS — M1990 Unspecified osteoarthritis, unspecified site: Secondary | ICD-10-CM | POA: Insufficient documentation

## 2014-10-16 DIAGNOSIS — K122 Cellulitis and abscess of mouth: Secondary | ICD-10-CM | POA: Insufficient documentation

## 2014-10-16 DIAGNOSIS — E119 Type 2 diabetes mellitus without complications: Secondary | ICD-10-CM | POA: Insufficient documentation

## 2014-10-16 MED ORDER — SODIUM CHLORIDE 0.9 % IV BOLUS (SEPSIS)
1000.0000 mL | Freq: Once | INTRAVENOUS | Status: AC
  Administered 2014-10-16: 1000 mL via INTRAVENOUS

## 2014-10-16 MED ORDER — EPINEPHRINE 0.3 MG/0.3ML IJ SOAJ: 0.3000 mg | Freq: Once | INTRAMUSCULAR | Status: DC

## 2014-10-16 MED ORDER — PREDNISONE 50 MG PO TABS: ORAL_TABLET | ORAL | Status: DC

## 2014-10-16 MED ORDER — FAMOTIDINE IN NACL 20-0.9 MG/50ML-% IV SOLN
20.0000 mg | Freq: Once | INTRAVENOUS | Status: AC
  Administered 2014-10-16: 20 mg via INTRAVENOUS
  Filled 2014-10-16: qty 50

## 2014-10-16 MED ORDER — EPINEPHRINE HCL 1 MG/ML IJ SOLN
0.3000 mg | Freq: Once | INTRAMUSCULAR | Status: AC
  Administered 2014-10-16: 0.3 mg via INTRAMUSCULAR
  Filled 2014-10-16: qty 1

## 2014-10-16 MED ORDER — PREDNISONE 10 MG PO TABS
60.0000 mg | ORAL_TABLET | Freq: Once | ORAL | Status: AC
  Administered 2014-10-16: 60 mg via ORAL
  Filled 2014-10-16 (×2): qty 1

## 2014-10-16 NOTE — Discharge Instructions (Signed)
Prescription for prednisone. Questionable allergy to Percocet. Return if worse.

## 2014-10-16 NOTE — ED Provider Notes (Signed)
Recheck at 0900:   Uvula is still red and inflamed. Patient is able to swallow. Normal airway. Discussed findings with patient, her sister, her mother. Discharge medications prednisone. She will return if symptoms worsen.  Nat Christen, MD 10/16/14 [phone]

## 2014-10-16 NOTE — ED Notes (Signed)
Pt ambulatory to bathroom with steady gait.

## 2014-10-16 NOTE — ED Notes (Signed)
Pt seen here and discharged after having lithotripsy and then having urinary retention in the e.d.   Pt states after she got home and slept for a couple of hours, states she awoke with a thick feeling in her throat and noticed her uvula was very swollen and hanging low in the back of her throat.  Pt denies itching or rash or other symptoms

## 2014-10-16 NOTE — ED Provider Notes (Signed)
CSN: [id number]     Arrival date & time 10/16/14  0447 History   First MD Initiated Contact with Patient 10/16/14 0454     Chief Complaint  Patient presents with  . Allergic Reaction     (Consider location/radiation/quality/duration/timing/severity/associated sxs/prior Treatment) Patient is a 44 y.o. female presenting with pharyngitis. The history is provided by the patient.  Sore Throat This is a new problem. The current episode started 1 to 2 hours ago. The problem occurs constantly. The problem has not changed since onset.Pertinent negatives include no chest pain, no headaches and no shortness of breath. Nothing aggravates the symptoms. Nothing relieves the symptoms. She has tried nothing for the symptoms. The treatment provided no relief.    Past Medical History  Diagnosis Date  . Hypertension   . Sleep apnea   . Obesity   . Diabetes mellitus without complication ([place])   . Arthritis    Past Surgical History  Procedure Laterality Date  . Tonsillectomy    . Abdominal hysterectomy    . Abdominal surgery    . Lithotripsy     Family History  Problem Relation Age of Onset  . Asthma Other   . Cancer Other   . COPD Other   . Hypertension Other   . Hyperlipidemia Other   . Stroke Other   . Heart disease Other   . Diabetes Other    Social History  Substance Use Topics  . Smoking status: Former Smoker -- 1.00 packs/day  . Smokeless tobacco: Former Systems developer    Quit date: 11/24/2012  . Alcohol Use: 0.0 oz/week    0 Standard drinks or equivalent per week     Comment: occasionally   OB History    No data available     Review of Systems  Constitutional: Negative for fever.  HENT: Positive for trouble swallowing (secondary to swollen uvula).   Respiratory: Negative for cough, choking and shortness of breath.   Cardiovascular: Negative for chest pain.  Gastrointestinal: Negative for nausea, vomiting and diarrhea.  Musculoskeletal: Negative for back pain, joint swelling and  neck pain.  Neurological: Negative for headaches.  All other systems reviewed and are negative.     Allergies  Review of patient's allergies indicates no known allergies.  Home Medications   Prior to Admission medications   Medication Sig Start Date End Date Taking? Authorizing Provider  acetaminophen (TYLENOL) 500 MG tablet Take 500-1,000 mg by mouth every 6 (six) hours as needed for moderate pain or headache.    Historical Provider, MD  albuterol (PROVENTIL HFA;VENTOLIN HFA) 108 (90 BASE) MCG/ACT inhaler Inhale 1-2 puffs into the lungs every 6 (six) hours as needed for wheezing or shortness of breath.    Historical Provider, MD  ALPRAZolam Duanne Moron) 0.5 MG tablet Take 0.5 mg by mouth 3 (three) times daily as needed for anxiety.     Historical Provider, MD  bisoprolol-hydrochlorothiazide (ZIAC) 2.5-6.25 MG per tablet Take 1 tablet by mouth at bedtime.     Historical Provider, MD  ciprofloxacin (CIPRO) 500 MG tablet Take 1 tablet (500 mg total) by mouth 2 (two) times daily. One po bid x 7 days 10/15/14   Milton Ferguson, MD  furosemide (LASIX) 20 MG tablet Take 20 mg by mouth daily.    Historical Provider, MD  HYDROcodone-acetaminophen (NORCO/VICODIN) 5-325 MG tablet Take 1 tablet by mouth every 6 (six) hours as needed for moderate pain.    Historical Provider, MD  HYDROmorphone (DILAUDID) 4 MG tablet Take 1 tablet (4 mg  total) by mouth every 4 (four) hours as needed for severe pain. 10/15/14   Milton Ferguson, MD  meloxicam (MOBIC) 7.5 MG tablet Take 7.5 mg by mouth 2 (two) times daily.    Historical Provider, MD  metFORMIN (GLUCOPHAGE) 500 MG tablet Take 500 mg by mouth 2 (two) times daily with a meal.    Historical Provider, MD  promethazine (PHENERGAN) 25 MG tablet Take 25 mg by mouth every 6 (six) hours as needed for nausea or vomiting.    Historical Provider, MD  sertraline (ZOLOFT) 100 MG tablet Take 200 mg by mouth at bedtime.     Historical Provider, MD  traMADol (ULTRAM) 50 MG tablet  Take 1 tablet (50 mg total) by mouth every 6 (six) hours as needed. Patient taking differently: Take 50 mg by mouth every 6 (six) hours as needed for moderate pain.  10/01/14   [name], MD   BP 131/90 mmHg  Pulse 87  Temp(Src) 98.4 F (36.9 C) (Oral)  Resp 20  Ht 5\' 8"  (1.727 m)  Wt 235 lb (106.595 kg)  BMI 35.74 kg/m2  SpO2 95% Physical Exam  Constitutional: She is oriented to person, place, and time. She appears well-developed and well-nourished.  HENT:  Head: Normocephalic and atraumatic.  Mouth/Throat: Uvula swelling present.  Both cheeks slightly swollen, no sublingual swelling or ttp  Eyes: Conjunctivae and EOM are normal. Right eye exhibits no discharge. Left eye exhibits no discharge.  Cardiovascular: Normal rate and regular rhythm.   Pulmonary/Chest: Effort normal and breath sounds normal. No respiratory distress.  Abdominal: Soft. She exhibits no distension. There is no tenderness. There is no rebound.  Musculoskeletal: Normal range of motion. She exhibits no edema or tenderness.  Neurological: She is alert and oriented to person, place, and time.  Skin: Skin is warm and dry.  Nursing note and vitals reviewed.   ED Course  Procedures (including critical care time) Labs Review Labs Reviewed - No data to display  Imaging Review Dg Abd 1 View  10/15/2014  CLINICAL DATA:  Preoperative evaluation for Hino removal. EXAM: ABDOMEN - 1 VIEW COMPARISON:  Abdominal radiograph 10/05/2014; CT 10/01/2014 FINDINGS: 8 mm Jeziorski persists and projects over the right renal hilum. No additional nephrolithiasis identified. Nonobstructed bowel gas pattern. Osseous skeleton unremarkable. IMPRESSION: Unchanged 8 mm Slaght projecting over the right renal hilum. Electronically Signed   By: Lovey Newcomer M.D.   On: 10/15/2014 09:38   Ct Renal [place] Study  10/15/2014  CLINICAL DATA:  44 year old female with right abdominal pelvic pain following lithotripsy today. Nausea vomiting and  urgency. EXAM: CT ABDOMEN AND PELVIS WITHOUT CONTRAST TECHNIQUE: Multidetector CT imaging of the abdomen and pelvis was performed following the standard protocol without IV contrast. COMPARISON:  10/01/2014 and prior CTs. FINDINGS: Please note that parenchymal abnormalities may be missed without intravenous contrast. Lower chest:  Unremarkable. Hepatobiliary: Hepatic steatosis identified. The gallbladder is unremarkable. There is no evidence of biliary dilatation. Pancreas: Unremarkable Spleen: Unremarkable Adrenals/Urinary Tract: There are at least 6 calculi within the distal right ureter, 2 which measure 4 mm and the others are punctate. Moderate right hydroureteronephrosis is identified. Multiple punctate nonobstructing renal calculi within the right renal pelvis and right lower kidney noted. A 7 mm calculus within the right lower kidney is also identified. The left kidney and adrenal glands are unremarkable. A Foley catheter is present within the bladder. Stomach/Bowel: Unremarkable. There is no evidence of bowel obstruction or definite bowel wall thickening. The appendix is normal. Vascular/Lymphatic: Unremarkable.  No enlarged lymph nodes or abdominal aortic aneurysm. Reproductive: The patient is status post hysterectomy. There is no evidence of adnexal mass. Other: No free fluid, focal collection or pneumoperitoneum. Musculoskeletal: No acute or suspicious abnormalities. IMPRESSION: Multiple distal right ureteral calculi (punctate and 4 mm) with moderate right hydroureteronephrosis. Multiple nonobstructing right renal calculi. Hepatic steatosis. Electronically Signed   By: Margarette Canada M.D.   On: 10/15/2014 22:22   I have personally reviewed and evaluated these images and lab results as part of my medical decision-making.   EKG Interpretation None      MDM   Final diagnoses:  Uvulitis   Allergic reaction vs angioedema. Will tx with epi and observe.   Reevaluation around 0800, uvula still  swollen, not much worse. Pain controlled. No further angioedema. Will need approximately 1-2 hours more observation to ensufe no worsening, anticipate discharge home. Care to Dr. Lacinda Axon around [phone] for reevaluation.       Merrily Pew, MD 10/16/14 2128

## 2014-10-16 NOTE — ED Notes (Signed)
Pt alert & oriented x4, stable gait. Patient given discharge instructions, paperwork & prescription(s). Patient  instructed to stop at the registration desk to finish any additional paperwork. Patient verbalized understanding. Pt left department w/ no further questions. 

## 2014-10-17 LAB — URINE CULTURE
CULTURE: NO GROWTH
Special Requests: NORMAL

## 2014-11-10 DIAGNOSIS — Z87891 Personal history of nicotine dependence: Secondary | ICD-10-CM | POA: Insufficient documentation

## 2016-11-20 ENCOUNTER — Encounter (HOSPITAL_COMMUNITY): Payer: Self-pay | Admitting: Emergency Medicine

## 2016-11-20 ENCOUNTER — Emergency Department (HOSPITAL_COMMUNITY)
Admission: EM | Admit: 2016-11-20 | Discharge: 2016-11-21 | Disposition: A | Discharge status: Home / Self Care | Payer: Self-pay | Attending: Emergency Medicine | Admitting: Emergency Medicine

## 2016-11-20 DIAGNOSIS — Z79899 Other long term (current) drug therapy: Secondary | ICD-10-CM | POA: Insufficient documentation

## 2016-11-20 DIAGNOSIS — M79672 Pain in left foot: Secondary | ICD-10-CM

## 2016-11-20 DIAGNOSIS — Z87891 Personal history of nicotine dependence: Secondary | ICD-10-CM | POA: Insufficient documentation

## 2016-11-20 DIAGNOSIS — I10 Essential (primary) hypertension: Secondary | ICD-10-CM | POA: Insufficient documentation

## 2016-11-20 DIAGNOSIS — M79671 Pain in right foot: Secondary | ICD-10-CM | POA: Insufficient documentation

## 2016-11-20 DIAGNOSIS — E1142 Type 2 diabetes mellitus with diabetic polyneuropathy: Secondary | ICD-10-CM | POA: Insufficient documentation

## 2016-11-20 DIAGNOSIS — E0842 Diabetes mellitus due to underlying condition with diabetic polyneuropathy: Secondary | ICD-10-CM

## 2016-11-20 LAB — BASIC METABOLIC PANEL
ANION GAP: 11 (ref 5–15)
BUN: 10 mg/dL (ref 6–20)
CHLORIDE: 100 mmol/L — AB (ref 101–111)
CO2: 26 mmol/L (ref 22–32)
Calcium: 10 mg/dL (ref 8.9–10.3)
Creatinine, Ser: 0.76 mg/dL (ref 0.44–1.00)
Glucose, Bld: 267 mg/dL — ABNORMAL HIGH (ref 65–99)
POTASSIUM: 3.7 mmol/L (ref 3.5–5.1)
SODIUM: 137 mmol/L (ref 135–145)

## 2016-11-20 LAB — CK: CK TOTAL: 62 U/L (ref 38–234)

## 2016-11-20 MED ORDER — NAPROXEN 250 MG PO TABS
500.0000 mg | ORAL_TABLET | Freq: Once | ORAL | Status: AC
  Administered 2016-11-20: 500 mg via ORAL
  Filled 2016-11-20: qty 2

## 2016-11-20 MED ORDER — GABAPENTIN 100 MG PO CAPS
100.0000 mg | ORAL_CAPSULE | Freq: Once | ORAL | Status: AC
  Administered 2016-11-20: 100 mg via ORAL
  Filled 2016-11-20: qty 1

## 2016-11-20 NOTE — ED Provider Notes (Signed)
[facility] EMERGENCY DEPARTMENT Provider Note   CSN: [id number] Arrival date & time: 11/20/16  1929     History   Chief Complaint Chief Complaint  Patient presents with  . Foot Pain    HPI Kimberly Marks is a 46 y.o. female.  Patient is a diabetic complaining of bilateral foot pain for the past 3 weeks.  Describes pain on the plantar surface of both feet that is worse when she walks.  She describes this as "knife sticking in my feet" that has been going on without trauma or falls.  Pain is worse in the bottom of her feet and heels.  She has been taking ice and ibuprofen at home without relief.  Denies fever, chills, nausea or vomiting.  She states she does not take any medications for diabetes and that does not check her sugars at home.  Chronic back pain which is unchanged.  No numbness or tingling.  No bowel or bladder incontinence.  No fever.   The history is provided by the patient.  Foot Pain  Associated symptoms include chest pain. Pertinent negatives include no abdominal pain, no headaches and no shortness of breath.    Past Medical History:  Diagnosis Date  . Arthritis   . Diabetes mellitus without complication ([place])   . Hypertension   . Obesity   . Sleep apnea     Patient Active Problem List   Diagnosis Date Noted  . Prediabetes 01/29/2014  . Obesity 01/29/2014  . BACK PAIN 05/20/2008    Past Surgical History:  Procedure Laterality Date  . ABDOMINAL HYSTERECTOMY    . ABDOMINAL SURGERY    . LITHOTRIPSY    . TONSILLECTOMY      OB History    No data available       Home Medications    Prior to Admission medications   Medication Sig Start Date End Date Taking? Authorizing Provider  acetaminophen (TYLENOL) 500 MG tablet Take 500-1,000 mg by mouth every 6 (six) hours as needed for moderate pain or headache.    [provider]  albuterol (PROVENTIL HFA;VENTOLIN HFA) 108 (90 BASE) MCG/ACT inhaler Inhale 1-2 puffs into the lungs every 6 (six)  hours as needed for wheezing or shortness of breath.    [provider]  ALPRAZolam Duanne Moron) 0.5 MG tablet Take 0.5 mg by mouth 3 (three) times daily as needed for anxiety.     [provider]  bisoprolol-hydrochlorothiazide (ZIAC) 2.5-6.25 MG per tablet Take 1 tablet by mouth at bedtime.     [provider]  ciprofloxacin (CIPRO) 500 MG tablet Take 1 tablet (500 mg total) by mouth 2 (two) times daily. One po bid x 7 days 10/15/14   Milton Ferguson, MD  furosemide (LASIX) 20 MG tablet Take 20 mg by mouth daily.    [provider]  HYDROcodone-acetaminophen (NORCO/VICODIN) 5-325 MG tablet Take 1 tablet by mouth every 6 (six) hours as needed for moderate pain.    [provider]  HYDROmorphone (DILAUDID) 4 MG tablet Take 1 tablet (4 mg total) by mouth every 4 (four) hours as needed for severe pain. 10/15/14   Milton Ferguson, MD  meloxicam (MOBIC) 7.5 MG tablet Take 7.5 mg by mouth 2 (two) times daily.    [provider]  predniSONE (DELTASONE) 50 MG tablet 1 tablet daily for 6 days 10/16/14   Nat Christen, MD  promethazine (PHENERGAN) 25 MG tablet Take 25 mg by mouth every 6 (six) hours as needed for nausea  or vomiting.    [provider]  sertraline (ZOLOFT) 100 MG tablet Take 200 mg by mouth at bedtime.     [provider]  traMADol (ULTRAM) 50 MG tablet Take 1 tablet (50 mg total) by mouth every 6 (six) hours as needed. Patient taking differently: Take 50 mg by mouth every 6 (six) hours as needed for moderate pain.  10/01/14   [name], MD    Family History Family History  Problem Relation Age of Onset  . Asthma Other   . Cancer Other   . COPD Other   . Hypertension Other   . Hyperlipidemia Other   . Stroke Other   . Heart disease Other   . Diabetes Other     Social History Social History   Tobacco Use  . Smoking status: Former Smoker    Packs/day: 1.00  . Smokeless tobacco: Former Systems developer    Quit date:  11/24/2012  Substance Use Topics  . Alcohol use: Yes    Alcohol/week: 0.0 oz    Comment: occasionally  . Drug use: No     Allergies   Patient has no known allergies.   Review of Systems Review of Systems  Constitutional: Negative for activity change, appetite change and fever.  HENT: Negative for congestion and rhinorrhea.   Eyes: Negative for visual disturbance.  Respiratory: Negative for cough, chest tightness and shortness of breath.   Cardiovascular: Positive for chest pain.  Gastrointestinal: Negative for abdominal pain, nausea and vomiting.  Genitourinary: Negative for dysuria, hematuria, vaginal bleeding and vaginal discharge.  Musculoskeletal: Positive for arthralgias and neck pain.  Skin: Negative for wound.  Neurological: Negative for dizziness, tremors, light-headedness and headaches.   all other systems are negative except as noted in the HPI and PMH.     Physical Exam Updated Vital Signs BP (!) 132/98   Pulse 94   Temp 98.6 F (37 C) (Oral)   Resp 17   Ht 5\' 8"  (1.727 m)   Wt 115.7 kg (255 lb)   SpO2 96%   BMI 38.77 kg/m   Physical Exam  Constitutional: She is oriented to person, place, and time. She appears well-developed and well-nourished. No distress.  HENT:  Head: Normocephalic and atraumatic.  Mouth/Throat: Oropharynx is clear and moist. No oropharyngeal exudate.  Eyes: Conjunctivae and EOM are normal. Pupils are equal, round, and reactive to light.  Neck: Normal range of motion. Neck supple.  No meningismus.  Cardiovascular: Normal rate, regular rhythm, normal heart sounds and intact distal pulses.  No murmur heard. Pulmonary/Chest: Effort normal and breath sounds normal. No respiratory distress.  Abdominal: Soft. There is no tenderness. There is no rebound and no guarding.  Musculoskeletal: Normal range of motion. She exhibits no edema or tenderness.  Bilateral feet normal to inspection.  No open wounds or erythema.  Intact DP and PT pulses.   Flexion and extension of ankle and great toe intact. Minimal tenderness to plantar surface  Neurological: She is alert and oriented to person, place, and time. No cranial nerve deficit. She exhibits normal muscle tone. Coordination normal.   5/5 strength throughout. CN 2-12 intact.Equal grip strength.   Skin: Skin is warm.  Psychiatric: She has a normal mood and affect. Her behavior is normal.  Nursing note and vitals reviewed.    ED Treatments / Results  Labs (all labs ordered are listed, but only abnormal results are displayed) Labs Reviewed  CBC WITH DIFFERENTIAL/PLATELET - Abnormal; Notable for the following components:  Result Value   WBC 14.1 (*)    RBC 5.21 (*)    Hemoglobin 15.6 (*)    HCT 46.5 (*)    Lymphs Abs 6.1 (*)    All other components within normal limits  BASIC METABOLIC PANEL - Abnormal; Notable for the following components:   Chloride 100 (*)    Glucose, Bld 267 (*)    All other components within normal limits  CK  PATHOLOGIST SMEAR REVIEW    EKG  EKG Interpretation None       Radiology No results found.  Procedures Procedures (including critical care time)  Medications Ordered in ED Medications - No data to display   Initial Impression / Assessment and Plan / ED Course  I have reviewed the triage vital signs and the nursing notes.  Pertinent labs & imaging results that were available during my care of the patient were reviewed by me and considered in my medical decision making (see chart for details).    Diabetic with ongoing bilateral foot pain for 3 weeks.  There is no evidence of infection.  Neurovascularly intact.  Suspect likely diabetic neuropathy.  Neurovascularly intact.  Labs show hyperglycemia without DKA.  White blood cell count is chronically elevated.  Patient will be treated for likely neuropathic pain with gabapentin and anti-inflammatories.  PCP follow-up.  Return precautions discussed. also discussed to maintain tight  control of her blood sugars.  Final Clinical Impressions(s) / ED Diagnoses   Final diagnoses:  Bilateral foot pain  Diabetic polyneuropathy associated with diabetes mellitus due to underlying condition [facility])    ED Discharge Orders    None       Ezequiel Essex, MD 11/21/16 [phone]

## 2016-11-20 NOTE — ED Triage Notes (Signed)
Pain in both feet during walking, feels like knives sticking in her feet x 3 weeks.

## 2016-11-21 LAB — CBC WITH DIFFERENTIAL/PLATELET
BASOS PCT: 0 %
Basophils Absolute: 0 10*3/uL (ref 0.0–0.1)
EOS ABS: 0.1 10*3/uL (ref 0.0–0.7)
EOS PCT: 1 %
HEMATOCRIT: 46.5 % — AB (ref 36.0–46.0)
Hemoglobin: 15.6 g/dL — ABNORMAL HIGH (ref 12.0–15.0)
LYMPHS ABS: 6.1 10*3/uL — AB (ref 0.7–4.0)
Lymphocytes Relative: 43 %
MCH: 29.9 pg (ref 26.0–34.0)
MCHC: 33.5 g/dL (ref 30.0–36.0)
MCV: 89.3 fL (ref 78.0–100.0)
MONO ABS: 1 10*3/uL (ref 0.1–1.0)
Monocytes Relative: 7 %
Neutro Abs: 6.9 10*3/uL (ref 1.7–7.7)
Neutrophils Relative %: 49 %
PLATELETS: 292 10*3/uL (ref 150–400)
RBC: 5.21 MIL/uL — ABNORMAL HIGH (ref 3.87–5.11)
RDW: 13 % (ref 11.5–15.5)
WBC: 14.1 10*3/uL — AB (ref 4.0–10.5)

## 2016-11-21 MED ORDER — NAPROXEN 500 MG PO TABS: 500.0000 mg | ORAL_TABLET | Freq: Two times a day (BID) | ORAL | 0 refills | Status: DC

## 2016-11-21 MED ORDER — GABAPENTIN 300 MG PO CAPS: 300.0000 mg | ORAL_CAPSULE | Freq: Two times a day (BID) | ORAL | 0 refills | Status: DC

## 2016-11-21 NOTE — ED Notes (Signed)
Pt given nabs and sprite zero

## 2016-11-21 NOTE — Discharge Instructions (Signed)
There is no evidence of infection or problem with blood supply. Take the medication for likely diabetic neuropathy. Monitor your sugars closely and follow up with your doctor. Return to the ED if you develop new or worsening symptoms.

## 2016-11-22 LAB — PATHOLOGIST SMEAR REVIEW

## 2016-12-13 ENCOUNTER — Emergency Department (HOSPITAL_COMMUNITY)
Admission: EM | Admit: 2016-12-13 | Discharge: 2016-12-13 | Disposition: A | Discharge status: Home / Self Care | Payer: Self-pay | Attending: Emergency Medicine | Admitting: Emergency Medicine

## 2016-12-13 ENCOUNTER — Emergency Department (HOSPITAL_COMMUNITY): Payer: Self-pay

## 2016-12-13 ENCOUNTER — Encounter (HOSPITAL_COMMUNITY): Payer: Self-pay | Admitting: *Deleted

## 2016-12-13 ENCOUNTER — Other Ambulatory Visit: Payer: Self-pay

## 2016-12-13 DIAGNOSIS — R0602 Shortness of breath: Secondary | ICD-10-CM | POA: Insufficient documentation

## 2016-12-13 DIAGNOSIS — Z79899 Other long term (current) drug therapy: Secondary | ICD-10-CM | POA: Insufficient documentation

## 2016-12-13 DIAGNOSIS — F419 Anxiety disorder, unspecified: Secondary | ICD-10-CM | POA: Insufficient documentation

## 2016-12-13 DIAGNOSIS — I10 Essential (primary) hypertension: Secondary | ICD-10-CM | POA: Insufficient documentation

## 2016-12-13 DIAGNOSIS — Z87891 Personal history of nicotine dependence: Secondary | ICD-10-CM | POA: Insufficient documentation

## 2016-12-13 DIAGNOSIS — E1140 Type 2 diabetes mellitus with diabetic neuropathy, unspecified: Secondary | ICD-10-CM | POA: Insufficient documentation

## 2016-12-13 HISTORY — DX: Polyneuropathy, unspecified: G62.9

## 2016-12-13 LAB — CBC WITH DIFFERENTIAL/PLATELET
BASOS ABS: 0 10*3/uL (ref 0.0–0.1)
Basophils Relative: 0 %
EOS PCT: 1 %
Eosinophils Absolute: 0.2 10*3/uL (ref 0.0–0.7)
HEMATOCRIT: 45.5 % (ref 36.0–46.0)
Hemoglobin: 15.2 g/dL — ABNORMAL HIGH (ref 12.0–15.0)
LYMPHS ABS: 5.5 10*3/uL — AB (ref 0.7–4.0)
Lymphocytes Relative: 43 %
MCH: 29.9 pg (ref 26.0–34.0)
MCHC: 33.4 g/dL (ref 30.0–36.0)
MCV: 89.4 fL (ref 78.0–100.0)
MONO ABS: 0.7 10*3/uL (ref 0.1–1.0)
MONOS PCT: 5 %
NEUTROS ABS: 6.4 10*3/uL (ref 1.7–7.7)
Neutrophils Relative %: 51 %
PLATELETS: 215 10*3/uL (ref 150–400)
RBC: 5.09 MIL/uL (ref 3.87–5.11)
RDW: 13.1 % (ref 11.5–15.5)
WBC: 12.8 10*3/uL — ABNORMAL HIGH (ref 4.0–10.5)

## 2016-12-13 LAB — BASIC METABOLIC PANEL
Anion gap: 9 (ref 5–15)
BUN: 14 mg/dL (ref 6–20)
CALCIUM: 9 mg/dL (ref 8.9–10.3)
CHLORIDE: 103 mmol/L (ref 101–111)
CO2: 23 mmol/L (ref 22–32)
CREATININE: 0.7 mg/dL (ref 0.44–1.00)
GFR calc non Af Amer: >60 mL/min (ref >60)
GLUCOSE: 199 mg/dL — AB (ref 65–99)
Potassium: 3.5 mmol/L (ref 3.5–5.1)
Sodium: 135 mmol/L (ref 135–145)

## 2016-12-13 LAB — TROPONIN I: Troponin I: <0.03 ng/mL (ref <0.03)

## 2016-12-13 LAB — BRAIN NATRIURETIC PEPTIDE: B Natriuretic Peptide: 153 pg/mL — ABNORMAL HIGH (ref 0.0–100.0)

## 2016-12-13 MED ORDER — IPRATROPIUM-ALBUTEROL 0.5-2.5 (3) MG/3ML IN SOLN
3.0000 mL | Freq: Once | RESPIRATORY_TRACT | Status: AC
  Administered 2016-12-13: 3 mL via RESPIRATORY_TRACT
  Filled 2016-12-13: qty 3

## 2016-12-13 MED ORDER — AEROCHAMBER PLUS FLO-VU MEDIUM MISC
1.0000 | Freq: Once | Status: AC
  Administered 2016-12-13: 1
  Filled 2016-12-13 (×2): qty 1

## 2016-12-13 MED ORDER — ALBUTEROL SULFATE HFA 108 (90 BASE) MCG/ACT IN AERS
1.0000 | INHALATION_SPRAY | RESPIRATORY_TRACT | Status: DC | PRN
  Filled 2016-12-13: qty 6.7

## 2016-12-13 NOTE — ED Triage Notes (Signed)
Pt c/o intermittent sob for the past two days, denies any cough,

## 2016-12-13 NOTE — ED Provider Notes (Signed)
[facility] EMERGENCY DEPARTMENT Provider Note   CSN: [id number] Arrival date & time: 12/13/16  1903     History   Chief Complaint Chief Complaint  Patient presents with  . Shortness of Breath    HPI Kimberly Marks is a 46 y.o. female.  Pt presents to the ED today with sob.  The pt said that she has been anxious b/c she's been stuck inside due to the snow.  She walked outside yesterday and sob became worse.  She said her boyfriend told her there was nothing wrong with her, and that made her anxiety worse.  She called her pcp to request a rx for albuterol inhaler, but her doctor told her that she needed to come to the ED for a CXR.  The pt has not had a cough or a fever.  She has not taken her medications yesterday or today.      Past Medical History:  Diagnosis Date  . Arthritis   . Diabetes mellitus without complication ([place])   . Hypertension   . Neuropathy   . Obesity   . Sleep apnea     Patient Active Problem List   Diagnosis Date Noted  . Prediabetes 01/29/2014  . Obesity 01/29/2014  . BACK PAIN 05/20/2008    Past Surgical History:  Procedure Laterality Date  . ABDOMINAL HYSTERECTOMY    . ABDOMINAL SURGERY    . LITHOTRIPSY    . TONSILLECTOMY      OB History    No data available       Home Medications    Prior to Admission medications   Medication Sig Start Date End Date Taking? Authorizing Provider  ALPRAZolam Duanne Moron) 0.5 MG tablet Take 0.5 mg by mouth 3 (three) times daily as needed for anxiety.     [provider]  bisoprolol-hydrochlorothiazide (ZIAC) 2.5-6.25 MG per tablet Take 1 tablet by mouth at bedtime.     [provider]  furosemide (LASIX) 20 MG tablet Take 20 mg daily as needed by mouth for fluid or edema.     [provider]  gabapentin (NEURONTIN) 300 MG capsule Take 1 capsule (300 mg total) 2 (two) times daily by mouth. 11/21/16   Rancour, Annie Main, MD  glimepiride (AMARYL) 2 MG tablet Take 2 mg daily with  breakfast by mouth.    [provider]  HYDROcodone-acetaminophen (NORCO/VICODIN) 5-325 MG tablet Take 1 tablet by mouth every 6 (six) hours as needed for moderate pain.    [provider]  meloxicam (MOBIC) 7.5 MG tablet Take 7.5 mg daily as needed by mouth for pain.     [provider]  naproxen (NAPROSYN) 500 MG tablet Take 1 tablet (500 mg total) 2 (two) times daily by mouth. 11/21/16   Rancour, Annie Main, MD  sertraline (ZOLOFT) 100 MG tablet Take 100 mg at bedtime by mouth.     [provider]    Family History Family History  Problem Relation Age of Onset  . Asthma Other   . Cancer Other   . COPD Other   . Hypertension Other   . Hyperlipidemia Other   . Stroke Other   . Heart disease Other   . Diabetes Other     Social History Social History   Tobacco Use  . Smoking status: Former Smoker    Packs/day: 1.00  . Smokeless tobacco: Former Systems developer    Quit date: 11/24/2012  Substance Use Topics  . Alcohol use: Yes    Alcohol/week: 0.0 oz  Comment: occasionally  . Drug use: No     Allergies   Patient has no known allergies.   Review of Systems Review of Systems  Respiratory: Positive for shortness of breath.   Psychiatric/Behavioral: The patient is nervous/anxious.   All other systems reviewed and are negative.    Physical Exam Updated Vital Signs BP (!) 146/107 (BP Location: Left Arm)   Pulse (!) 119   Temp 97.8 F (36.6 C) (Oral)   Resp (!) 23   Ht 5\' 8"  (1.727 m)   Wt 104.3 kg (230 lb)   SpO2 97%   BMI 34.97 kg/m   Physical Exam  Constitutional: She is oriented to person, place, and time. She appears well-developed and well-nourished.  HENT:  Head: Normocephalic and atraumatic.  Mouth/Throat: Oropharynx is clear and moist.  Eyes: EOM are normal. Pupils are equal, round, and reactive to light.  Neck: Normal range of motion. Neck supple.  Cardiovascular: Normal rate, regular rhythm, normal heart sounds and intact  distal pulses.  Pulmonary/Chest: Effort normal and breath sounds normal.  Abdominal: Soft. Bowel sounds are normal.  Musculoskeletal: Normal range of motion.       Right lower leg: Normal.       Left lower leg: Normal.  Neurological: She is alert and oriented to person, place, and time.  Skin: Skin is warm. Capillary refill takes less than 2 seconds.  Psychiatric: Her behavior is normal. Her mood appears anxious.  Nursing note and vitals reviewed.    ED Treatments / Results  Labs (all labs ordered are listed, but only abnormal results are displayed) Labs Reviewed  BASIC METABOLIC PANEL - Abnormal; Notable for the following components:      Result Value   Glucose, Bld 199 (*)    All other components within normal limits  BRAIN NATRIURETIC PEPTIDE - Abnormal; Notable for the following components:   B Natriuretic Peptide 153.0 (*)    All other components within normal limits  CBC WITH DIFFERENTIAL/PLATELET - Abnormal; Notable for the following components:   WBC 12.8 (*)    Hemoglobin 15.2 (*)    Lymphs Abs 5.5 (*)    All other components within normal limits  TROPONIN I    EKG  EKG Interpretation  Date/Time:  Wednesday December 13 2016 21:06:32 EST Ventricular Rate:  95 PR Interval:  136 QRS Duration: 98 QT Interval:  409 QTC Calculation: 515 R Axis:   80 Text Interpretation:  Sinus rhythm Low voltage, precordial leads Borderline repolarization abnormality Prolonged QT interval Confirmed by Isla Pence [phone]) on 12/13/2016 9:12:02 PM       Radiology Dg Chest 2 View  Result Date: 12/13/2016 CLINICAL DATA:  Dyspnea since yesterday. EXAM: CHEST  2 VIEW COMPARISON:  None. FINDINGS: The lungs are clear. The pulmonary vasculature is normal. Heart size is normal. Hilar and mediastinal contours are unremarkable. There is no pleural effusion. IMPRESSION: No active cardiopulmonary disease. Electronically Signed   By: Andreas Newport M.D.   On: 12/13/2016 19:51     Procedures Procedures (including critical care time)  Medications Ordered in ED Medications  albuterol (PROVENTIL HFA;VENTOLIN HFA) 108 (90 Base) MCG/ACT inhaler 1-2 puff (not administered)  ipratropium-albuterol (DUONEB) 0.5-2.5 (3) MG/3ML nebulizer solution 3 mL (3 mLs Nebulization Given 12/13/16 2146)  AEROCHAMBER PLUS FLO-VU MEDIUM MISC 1 each (1 each Other Given 12/13/16 2146)     Initial Impression / Assessment and Plan / ED Course  I have reviewed the triage vital signs and the nursing notes.  Pertinent labs & imaging results that were available during my care of the patient were reviewed by me and considered in my medical decision making (see chart for details).    Pt is feeling much better.  She is given an inhaler.  She knows to return if worse and to f/u with pcp.  Final Clinical Impressions(s) / ED Diagnoses   Final diagnoses:  Shortness of breath  Anxiety    ED Discharge Orders    None       Isla Pence, MD 12/13/16 2252

## 2017-02-04 ENCOUNTER — Other Ambulatory Visit: Payer: Self-pay

## 2017-02-04 ENCOUNTER — Emergency Department (HOSPITAL_COMMUNITY)
Admission: EM | Admit: 2017-02-04 | Discharge: 2017-02-05 | Disposition: A | Discharge status: Home / Self Care | Payer: Self-pay | Attending: Emergency Medicine | Admitting: Emergency Medicine

## 2017-02-04 ENCOUNTER — Encounter (HOSPITAL_COMMUNITY): Payer: Self-pay | Admitting: Emergency Medicine

## 2017-02-04 DIAGNOSIS — I10 Essential (primary) hypertension: Secondary | ICD-10-CM | POA: Insufficient documentation

## 2017-02-04 DIAGNOSIS — R519 Headache, unspecified: Secondary | ICD-10-CM

## 2017-02-04 DIAGNOSIS — Z79899 Other long term (current) drug therapy: Secondary | ICD-10-CM | POA: Insufficient documentation

## 2017-02-04 DIAGNOSIS — J019 Acute sinusitis, unspecified: Secondary | ICD-10-CM

## 2017-02-04 DIAGNOSIS — R51 Headache: Secondary | ICD-10-CM

## 2017-02-04 DIAGNOSIS — Z87891 Personal history of nicotine dependence: Secondary | ICD-10-CM | POA: Insufficient documentation

## 2017-02-04 DIAGNOSIS — E119 Type 2 diabetes mellitus without complications: Secondary | ICD-10-CM | POA: Insufficient documentation

## 2017-02-04 MED ORDER — DIPHENHYDRAMINE HCL 50 MG/ML IJ SOLN
25.0000 mg | Freq: Once | INTRAMUSCULAR | Status: AC
  Administered 2017-02-04: 25 mg via INTRAVENOUS
  Filled 2017-02-04: qty 1

## 2017-02-04 MED ORDER — METOCLOPRAMIDE HCL 5 MG/ML IJ SOLN
10.0000 mg | Freq: Once | INTRAMUSCULAR | Status: AC
  Administered 2017-02-04: 10 mg via INTRAVENOUS
  Filled 2017-02-04: qty 2

## 2017-02-04 MED ORDER — KETOROLAC TROMETHAMINE 30 MG/ML IJ SOLN
30.0000 mg | Freq: Once | INTRAMUSCULAR | Status: AC
  Administered 2017-02-04: 30 mg via INTRAVENOUS
  Filled 2017-02-04: qty 1

## 2017-02-04 NOTE — ED Triage Notes (Signed)
Patient c/o congestion, sore throat, headache, body aches and chills since yesterday.

## 2017-02-04 NOTE — ED Triage Notes (Signed)
Patient reports emesis today.

## 2017-02-04 NOTE — ED Provider Notes (Signed)
[facility] EMERGENCY DEPARTMENT Provider Note   CSN: [id number] Arrival date & time: 02/04/17  1644     History   Chief Complaint Chief Complaint  Patient presents with  . Influenza    HPI Kimberly Marks is a 47 y.o. female.  HPI   Kimberly Marks is a 47 y.o. female who presents to the Emergency Department complaining of sudden onset of nasal congestion, sore throat, frontal headache, generalized body aches and chills.  Symptoms began yesterday.  She describes the pain and pressure to her face and through her nose.  Symptoms are associated with rhinorrhea and a burning sensation to her nose.  She is tried over-the-counter cold and sinus medication with some relief.  Headache is described as constant and throbbing and gradual in onset since yesterday.  She is sensitive to light and sound.  She reports one episode of vomiting earlier today that she believes was associated with pain.  She states she did not take a flu shot this year, no known sick contacts.  She denies abdominal pain, chest pain, cough and shortness of breath.  No neck pain or stiffness.   Past Medical History:  Diagnosis Date  . Arthritis   . Diabetes mellitus without complication ([place])   . Hypertension   . Neuropathy   . Obesity   . Sleep apnea     Patient Active Problem List   Diagnosis Date Noted  . Prediabetes 01/29/2014  . Obesity 01/29/2014  . BACK PAIN 05/20/2008    Past Surgical History:  Procedure Laterality Date  . ABDOMINAL HYSTERECTOMY    . ABDOMINAL SURGERY    . LITHOTRIPSY    . TONSILLECTOMY      OB History    Gravida Para Term Preterm AB Living   1 1 1          SAB TAB Ectopic Multiple Live Births                   Home Medications    Prior to Admission medications   Medication Sig Start Date End Date Taking? Authorizing Provider  ALPRAZolam Duanne Moron) 0.5 MG tablet Take 0.5 mg by mouth 3 (three) times daily as needed for anxiety.     [provider]    bisoprolol-hydrochlorothiazide (ZIAC) 2.5-6.25 MG per tablet Take 1 tablet by mouth at bedtime.     [provider]  furosemide (LASIX) 20 MG tablet Take 20 mg daily as needed by mouth for fluid or edema.     [provider]  gabapentin (NEURONTIN) 300 MG capsule Take 1 capsule (300 mg total) 2 (two) times daily by mouth. 11/21/16   Rancour, Annie Main, MD  glimepiride (AMARYL) 2 MG tablet Take 2 mg daily with breakfast by mouth.    [provider]  HYDROcodone-acetaminophen (NORCO/VICODIN) 5-325 MG tablet Take 1 tablet by mouth every 6 (six) hours as needed for moderate pain.    [provider]  meloxicam (MOBIC) 7.5 MG tablet Take 7.5 mg daily as needed by mouth for pain.     [provider]  naproxen (NAPROSYN) 500 MG tablet Take 1 tablet (500 mg total) 2 (two) times daily by mouth. 11/21/16   Rancour, Annie Main, MD  sertraline (ZOLOFT) 100 MG tablet Take 100 mg at bedtime by mouth.     [provider]    Family History Family History  Problem Relation Age of Onset  . Asthma Other   . Cancer Other   . COPD Other   .  Hypertension Other   . Hyperlipidemia Other   . Stroke Other   . Heart disease Other   . Diabetes Other     Social History Social History   Tobacco Use  . Smoking status: Former Smoker    Packs/day: 1.00  . Smokeless tobacco: Former Systems developer    Quit date: 11/24/2012  Substance Use Topics  . Alcohol use: Yes    Alcohol/week: 0.0 oz    Comment: occasionally  . Drug use: No     Allergies   Patient has no known allergies.   Review of Systems Review of Systems  Constitutional: Negative for activity change, appetite change, chills and fever.  HENT: Positive for congestion, ear pain, sinus pressure, sinus pain and sore throat. Negative for facial swelling, rhinorrhea and trouble swallowing.   Eyes: Positive for photophobia. Negative for pain and visual disturbance.  Respiratory: Positive for cough. Negative for  shortness of breath, wheezing and stridor.   Cardiovascular: Negative for chest pain.  Gastrointestinal: Positive for vomiting. Negative for abdominal pain.  Musculoskeletal: Positive for myalgias. Negative for neck pain and neck stiffness.  Skin: Negative for rash and wound.  Neurological: Positive for headaches. Negative for dizziness, facial asymmetry, speech difficulty, weakness and numbness.  Hematological: Negative for adenopathy.  Psychiatric/Behavioral: Negative for confusion and decreased concentration.  All other systems reviewed and are negative.    Physical Exam Updated Vital Signs BP 138/79 (BP Location: Right Arm)   Pulse (!) 103   Temp 98.3 F (36.8 C) (Oral)   Resp 20   Ht 5\' 8"  (1.727 m)   Wt 111.1 kg (245 lb)   SpO2 99%   BMI 37.25 kg/m   Physical Exam  Constitutional: She is oriented to person, place, and time. She appears well-developed and well-nourished. No distress.  Pt is rocking back and forth on the stretcher, tearful.    HENT:  Head: Normocephalic and atraumatic.  Right Ear: Tympanic membrane and ear canal normal. No drainage. No mastoid tenderness. Tympanic membrane is not bulging. No hemotympanum.  Left Ear: Ear canal normal. There is tenderness. No drainage. No mastoid tenderness. Tympanic membrane is erythematous. Tympanic membrane is not bulging. No hemotympanum.  Nose: Mucosal edema and rhinorrhea present.  Mouth/Throat: Uvula is midline and mucous membranes are normal. No trismus in the jaw. No uvula swelling. Posterior oropharyngeal erythema present. No oropharyngeal exudate, posterior oropharyngeal edema or tonsillar abscesses.  Eyes: Conjunctivae and EOM are normal. Pupils are equal, round, and reactive to light.  Neck: Normal range of motion, full passive range of motion without pain and phonation normal. Neck supple. No spinous process tenderness and no muscular tenderness present. No neck rigidity. No Kernig's sign noted.  Cardiovascular:  Normal rate, regular rhythm and intact distal pulses.  No murmur heard. Pulmonary/Chest: Effort normal and breath sounds normal. No respiratory distress. She has no wheezes. She has no rales.  Abdominal: Soft. She exhibits no distension. There is no tenderness. There is no rebound and no guarding.  Musculoskeletal: Normal range of motion. She exhibits no edema.  Lymphadenopathy:    She has no cervical adenopathy.  Neurological: She is alert and oriented to person, place, and time. She has normal strength. No cranial nerve deficit or sensory deficit. She exhibits normal muscle tone. Coordination and gait normal. GCS eye subscore is 4. GCS verbal subscore is 5. GCS motor subscore is 6.  Reflex Scores:      Tricep reflexes are 2+ on the right side and 2+ on the left  side.      Bicep reflexes are 2+ on the right side and 2+ on the left side. CN III-XII grossly intact  Skin: Skin is warm and dry. Capillary refill takes less than 2 seconds. No rash noted.  Psychiatric: Thought content normal.  Nursing note and vitals reviewed.    ED Treatments / Results  Labs (all labs ordered are listed, but only abnormal results are displayed) Labs Reviewed - No data to display  EKG  EKG Interpretation None       Radiology No results found.  Procedures Procedures (including critical care time)  Medications Ordered in ED Medications  ketorolac (TORADOL) 30 MG/ML injection 30 mg (30 mg Intravenous Given 02/04/17 2239)  diphenhydrAMINE (BENADRYL) injection 25 mg (25 mg Intravenous Given 02/04/17 2239)  metoCLOPramide (REGLAN) injection 10 mg (10 mg Intravenous Given 02/04/17 2239)     Initial Impression / Assessment and Plan / ED Course  I have reviewed the triage vital signs and the nursing notes.  Pertinent labs & imaging results that were available during my care of the patient were reviewed by me and considered in my medical decision making (see chart for details).     pt is tearful, non  toxic appearing.  No nuchal rigidity, afebrile.  No focal neuro deficits.  headache is diffuse and gradual in onset and likely secondary to sinusitis.    On recheck, pt is feeling better and headache nearly resolved.  Pt states she is ready for d/c home.  She is ambulatory with a steady gait.  Agrees to tx plan and return precautions were discussed.     Final Clinical Impressions(s) / ED Diagnoses   Final diagnoses:  Acute non-recurrent sinusitis, unspecified location  Sinus headache    ED Discharge Orders    None       Kem Parkinson, PA-C 02/05/17 0043    Davonna Belling, MD 02/09/17 [phone]

## 2017-02-05 MED ORDER — AZITHROMYCIN 250 MG PO TABS: ORAL_TABLET | ORAL | 0 refills | Status: DC

## 2017-02-05 MED ORDER — DIPHENHYDRAMINE HCL 25 MG PO TABS: 25.0000 mg | ORAL_TABLET | Freq: Four times a day (QID) | ORAL | 0 refills | Status: DC

## 2017-02-05 NOTE — Discharge Instructions (Signed)
Drink plenty of fluids.  Follow-up with your doctor for recheck.  Return here for any worsening symtpoms

## 2017-02-19 ENCOUNTER — Other Ambulatory Visit: Payer: Self-pay

## 2017-02-19 ENCOUNTER — Emergency Department (HOSPITAL_COMMUNITY): Payer: Self-pay

## 2017-02-19 ENCOUNTER — Inpatient Hospital Stay (HOSPITAL_COMMUNITY)
Admission: EM | Admit: 2017-02-19 | Discharge: 2017-02-21 | DRG: 195 | Disposition: A | Discharge status: Home / Self Care | Payer: Self-pay | Attending: Internal Medicine | Admitting: Internal Medicine

## 2017-02-19 ENCOUNTER — Encounter (HOSPITAL_COMMUNITY): Payer: Self-pay | Admitting: Emergency Medicine

## 2017-02-19 DIAGNOSIS — Z9119 Patient's noncompliance with other medical treatment and regimen: Secondary | ICD-10-CM

## 2017-02-19 DIAGNOSIS — E669 Obesity, unspecified: Secondary | ICD-10-CM | POA: Diagnosis present

## 2017-02-19 DIAGNOSIS — Z90710 Acquired absence of both cervix and uterus: Secondary | ICD-10-CM

## 2017-02-19 DIAGNOSIS — E119 Type 2 diabetes mellitus without complications: Secondary | ICD-10-CM

## 2017-02-19 DIAGNOSIS — M1990 Unspecified osteoarthritis, unspecified site: Secondary | ICD-10-CM | POA: Diagnosis present

## 2017-02-19 DIAGNOSIS — F419 Anxiety disorder, unspecified: Secondary | ICD-10-CM | POA: Diagnosis present

## 2017-02-19 DIAGNOSIS — G4733 Obstructive sleep apnea (adult) (pediatric): Secondary | ICD-10-CM | POA: Diagnosis present

## 2017-02-19 DIAGNOSIS — Z8349 Family history of other endocrine, nutritional and metabolic diseases: Secondary | ICD-10-CM

## 2017-02-19 DIAGNOSIS — J189 Pneumonia, unspecified organism: Principal | ICD-10-CM | POA: Diagnosis present

## 2017-02-19 DIAGNOSIS — E1142 Type 2 diabetes mellitus with diabetic polyneuropathy: Secondary | ICD-10-CM | POA: Diagnosis present

## 2017-02-19 DIAGNOSIS — E1169 Type 2 diabetes mellitus with other specified complication: Secondary | ICD-10-CM

## 2017-02-19 DIAGNOSIS — F40240 Claustrophobia: Secondary | ICD-10-CM | POA: Diagnosis present

## 2017-02-19 DIAGNOSIS — G629 Polyneuropathy, unspecified: Secondary | ICD-10-CM

## 2017-02-19 DIAGNOSIS — E1159 Type 2 diabetes mellitus with other circulatory complications: Secondary | ICD-10-CM

## 2017-02-19 DIAGNOSIS — Z6837 Body mass index (BMI) 37.0-37.9, adult: Secondary | ICD-10-CM

## 2017-02-19 DIAGNOSIS — Z833 Family history of diabetes mellitus: Secondary | ICD-10-CM

## 2017-02-19 DIAGNOSIS — Z87891 Personal history of nicotine dependence: Secondary | ICD-10-CM

## 2017-02-19 DIAGNOSIS — I10 Essential (primary) hypertension: Secondary | ICD-10-CM | POA: Diagnosis present

## 2017-02-19 DIAGNOSIS — R9431 Abnormal electrocardiogram [ECG] [EKG]: Secondary | ICD-10-CM | POA: Diagnosis present

## 2017-02-19 DIAGNOSIS — G4730 Sleep apnea, unspecified: Secondary | ICD-10-CM | POA: Diagnosis present

## 2017-02-19 DIAGNOSIS — Z801 Family history of malignant neoplasm of trachea, bronchus and lung: Secondary | ICD-10-CM

## 2017-02-19 DIAGNOSIS — Z8249 Family history of ischemic heart disease and other diseases of the circulatory system: Secondary | ICD-10-CM

## 2017-02-19 DIAGNOSIS — Z823 Family history of stroke: Secondary | ICD-10-CM

## 2017-02-19 DIAGNOSIS — Z825 Family history of asthma and other chronic lower respiratory diseases: Secondary | ICD-10-CM

## 2017-02-19 LAB — COMPREHENSIVE METABOLIC PANEL
ALBUMIN: 4.2 g/dL (ref 3.5–5.0)
ALK PHOS: 114 U/L (ref 38–126)
ALT: 61 U/L — AB (ref 14–54)
AST: 62 U/L — ABNORMAL HIGH (ref 15–41)
Anion gap: 11 (ref 5–15)
BILIRUBIN TOTAL: 0.9 mg/dL (ref 0.3–1.2)
BUN: 9 mg/dL (ref 6–20)
CALCIUM: 9.4 mg/dL (ref 8.9–10.3)
CO2: 23 mmol/L (ref 22–32)
CREATININE: 0.77 mg/dL (ref 0.44–1.00)
Chloride: 102 mmol/L (ref 101–111)
GFR calc Af Amer: >60 mL/min (ref >60)
GFR calc non Af Amer: >60 mL/min (ref >60)
GLUCOSE: 188 mg/dL — AB (ref 65–99)
Potassium: 4.2 mmol/L (ref 3.5–5.1)
SODIUM: 136 mmol/L (ref 135–145)
TOTAL PROTEIN: 7.8 g/dL (ref 6.5–8.1)

## 2017-02-19 LAB — MAGNESIUM: MAGNESIUM: 1.6 mg/dL — AB (ref 1.7–2.4)

## 2017-02-19 LAB — CBC
HCT: 44.5 % (ref 36.0–46.0)
HEMOGLOBIN: 14.9 g/dL (ref 12.0–15.0)
MCH: 29.4 pg (ref 26.0–34.0)
MCHC: 33.5 g/dL (ref 30.0–36.0)
MCV: 87.9 fL (ref 78.0–100.0)
PLATELETS: 200 10*3/uL (ref 150–400)
RBC: 5.06 MIL/uL (ref 3.87–5.11)
RDW: 13 % (ref 11.5–15.5)
WBC: 8 10*3/uL (ref 4.0–10.5)

## 2017-02-19 LAB — URINALYSIS, ROUTINE W REFLEX MICROSCOPIC
Bilirubin Urine: NEGATIVE
Glucose, UA: 50 mg/dL — AB
HGB URINE DIPSTICK: NEGATIVE
Ketones, ur: 5 mg/dL — AB
Leukocytes, UA: NEGATIVE
Nitrite: NEGATIVE
Protein, ur: NEGATIVE mg/dL
SPECIFIC GRAVITY, URINE: 1.008 (ref 1.005–1.030)
pH: 7 (ref 5.0–8.0)

## 2017-02-19 LAB — TROPONIN I

## 2017-02-19 LAB — PHOSPHORUS: Phosphorus: 2.2 mg/dL — ABNORMAL LOW (ref 2.5–4.6)

## 2017-02-19 LAB — LIPASE, BLOOD: Lipase: 27 U/L (ref 11–51)

## 2017-02-19 MED ORDER — SODIUM CHLORIDE 0.9 % IV BOLUS (SEPSIS)
1000.0000 mL | Freq: Once | INTRAVENOUS | Status: AC
  Administered 2017-02-19: 1000 mL via INTRAVENOUS

## 2017-02-19 MED ORDER — IPRATROPIUM-ALBUTEROL 0.5-2.5 (3) MG/3ML IN SOLN
3.0000 mL | Freq: Once | RESPIRATORY_TRACT | Status: AC
  Administered 2017-02-19: 3 mL via RESPIRATORY_TRACT
  Filled 2017-02-19: qty 3

## 2017-02-19 MED ORDER — KETOROLAC TROMETHAMINE 30 MG/ML IJ SOLN
30.0000 mg | Freq: Once | INTRAMUSCULAR | Status: AC
  Administered 2017-02-19: 30 mg via INTRAVENOUS
  Filled 2017-02-19: qty 1

## 2017-02-19 MED ORDER — ONDANSETRON HCL 4 MG/2ML IJ SOLN
4.0000 mg | Freq: Once | INTRAMUSCULAR | Status: AC
  Administered 2017-02-19: 4 mg via INTRAVENOUS
  Filled 2017-02-19: qty 2

## 2017-02-19 MED ORDER — METHYLPREDNISOLONE SODIUM SUCC 125 MG IJ SOLR
125.0000 mg | Freq: Once | INTRAMUSCULAR | Status: AC
  Administered 2017-02-19: 125 mg via INTRAVENOUS
  Filled 2017-02-19: qty 2

## 2017-02-19 MED ORDER — SODIUM CHLORIDE 0.9 % IV SOLN
1.0000 g | Freq: Once | INTRAVENOUS | Status: AC
  Administered 2017-02-19: 1 g via INTRAVENOUS
  Filled 2017-02-19: qty 10

## 2017-02-19 MED ORDER — MAGNESIUM SULFATE 4 GM/100ML IV SOLN
4.0000 g | Freq: Once | INTRAVENOUS | Status: AC
  Administered 2017-02-20: 4 g via INTRAVENOUS
  Filled 2017-02-19: qty 100

## 2017-02-19 MED ORDER — POTASSIUM PHOSPHATE MONOBASIC 500 MG PO TABS
500.0000 mg | ORAL_TABLET | Freq: Three times a day (TID) | ORAL | Status: DC
  Filled 2017-02-19 (×3): qty 1

## 2017-02-19 MED ORDER — SODIUM CHLORIDE 0.9 % IV SOLN
500.0000 mg | INTRAVENOUS | Status: DC
  Administered 2017-02-19: 500 mg via INTRAVENOUS
  Filled 2017-02-19 (×2): qty 500

## 2017-02-19 NOTE — ED Notes (Signed)
Pt given saltines, ham sandwich and chips to eat if tolerable.

## 2017-02-19 NOTE — ED Notes (Signed)
Pt lungs are clear on ascultation. Pt begins to have anxiety when nurse told her that she would have to wait. Pt reassured and fan to cool to breath. Pt breathing more relaxed at present time.

## 2017-02-19 NOTE — ED Provider Notes (Addendum)
Level 5 caveat for urgent need for intervention. [facility] EMERGENCY DEPARTMENT Provider Note   CSN: [id number] Arrival date & time: 02/19/17  1650     History   Chief Complaint Chief Complaint  Patient presents with  . Emesis    HPI Kimberly Marks is a 47 y.o. female.  Level 5 caveat for urgent need for intervention.  Patient presents with nausea, vomiting, headache, cough, congestion worse today.  She has not been keeping fluids down.  Past medical history includes diabetes, hypertension, obesity.  Severity of symptoms is moderate.  Nothing makes symptoms better or worse.      Past Medical History:  Diagnosis Date  . Arthritis   . Diabetes mellitus without complication ([place])   . Hypertension   . Neuropathy   . Obesity   . Sleep apnea     Patient Active Problem List   Diagnosis Date Noted  . Prediabetes 01/29/2014  . Obesity 01/29/2014  . BACK PAIN 05/20/2008    Past Surgical History:  Procedure Laterality Date  . ABDOMINAL HYSTERECTOMY    . ABDOMINAL SURGERY    . LITHOTRIPSY    . TONSILLECTOMY      OB History    Gravida Para Term Preterm AB Living   1 1 1          SAB TAB Ectopic Multiple Live Births                   Home Medications    Prior to Admission medications   Medication Sig Start Date End Date Taking? Authorizing Provider  ALPRAZolam Duanne Moron) 0.5 MG tablet Take 0.5 mg by mouth 3 (three) times daily as needed for anxiety.     [provider]  azithromycin (ZITHROMAX) 250 MG tablet Take  2 tablets once a day for 3 days 02/05/17   Triplett, Tammy, PA-C  bisoprolol-hydrochlorothiazide (ZIAC) 2.5-6.25 MG per tablet Take 1 tablet by mouth at bedtime.     [provider]  diphenhydrAMINE (BENADRYL) 25 MG tablet Take 1 tablet (25 mg total) by mouth every 6 (six) hours. 02/05/17   Triplett, Tammy, PA-C  furosemide (LASIX) 20 MG tablet Take 20 mg daily as needed by mouth for fluid or edema.     [provider]  gabapentin  (NEURONTIN) 300 MG capsule Take 1 capsule (300 mg total) 2 (two) times daily by mouth. 11/21/16   Rancour, Annie Main, MD  glimepiride (AMARYL) 2 MG tablet Take 2 mg daily with breakfast by mouth.    [provider]  HYDROcodone-acetaminophen (NORCO/VICODIN) 5-325 MG tablet Take 1 tablet by mouth every 6 (six) hours as needed for moderate pain.    [provider]  sertraline (ZOLOFT) 100 MG tablet Take 100 mg at bedtime by mouth.     [provider]    Family History Family History  Problem Relation Age of Onset  . Asthma Other   . Cancer Other   . COPD Other   . Hypertension Other   . Hyperlipidemia Other   . Stroke Other   . Heart disease Other   . Diabetes Other     Social History Social History   Tobacco Use  . Smoking status: Former Smoker    Packs/day: 1.00  . Smokeless tobacco: Former Systems developer    Quit date: 11/24/2012  Substance Use Topics  . Alcohol use: Yes    Alcohol/week: 0.0 oz    Comment: occasionally  . Drug use: No     Allergies  Patient has no known allergies.   Review of Systems Review of Systems  Unable to perform ROS: Acuity of condition     Physical Exam Updated Vital Signs BP (!) 102/55   Pulse (!) 115   Temp (!) 100.8 F (38.2 C) (Oral)   Resp 20   Wt 111.1 kg (245 lb)   SpO2 93%   BMI 37.25 kg/m   Physical Exam  Constitutional: She is oriented to person, place, and time.  Pale, ill-appearing.  HENT:  Head: Normocephalic and atraumatic.  Eyes: Conjunctivae are normal.  Neck: Neck supple.  Cardiovascular: Normal rate and regular rhythm.  Pulmonary/Chest: Effort normal.  Scattered rhonchi.  Abdominal: Soft. Bowel sounds are normal.  Musculoskeletal: Normal range of motion.  Neurological: She is alert and oriented to person, place, and time.  Skin: Skin is warm and dry.  Psychiatric: She has a normal mood and affect. Her behavior is normal.  Nursing note and vitals reviewed.    ED Treatments / Results    Labs (all labs ordered are listed, but only abnormal results are displayed) Labs Reviewed  COMPREHENSIVE METABOLIC PANEL - Abnormal; Notable for the following components:      Result Value   Glucose, Bld 188 (*)    AST 62 (*)    ALT 61 (*)    All other components within normal limits  LIPASE, BLOOD  CBC  URINALYSIS, ROUTINE W REFLEX MICROSCOPIC    EKG  EKG Interpretation  Date/Time:  Monday February 19 2017 17:07:39 EST Ventricular Rate:  115 PR Interval:  132 QRS Duration: 86 QT Interval:  320 QTC Calculation: 442 R Axis:   96 Text Interpretation:  Sinus tachycardia Rightward axis Septal infarct , age undetermined Marked ST abnormality, possible lateral subendocardial injury Abnormal ECG Confirmed by Nat Christen [phone]) on 02/19/2017 7:13:39 PM       Radiology Dg Chest 2 View  Result Date: 02/19/2017 CLINICAL DATA:  Vomiting. Cough and chest congestion. History of diabetes. EXAM: CHEST  2 VIEW COMPARISON:  12/13/2016 FINDINGS: Heart size is normal. Mediastinal shadows are normal. There is central bronchial thickening. There is patchy density in both lower lobes consistent with mild pneumonia. No dense consolidation or lobar collapse. No effusions. Bony structures unremarkable. IMPRESSION: Patchy bilateral lower lobe pneumonia. Electronically Signed   By: Nelson Chimes M.D.   On: 02/19/2017 17:43    Procedures Procedures (including critical care time)  Medications Ordered in ED Medications  azithromycin (ZITHROMAX) 500 mg in sodium chloride 0.9 % 250 mL IVPB (500 mg Intravenous New Bag/Given 02/19/17 2015)  sodium chloride 0.9 % bolus 1,000 mL (1,000 mLs Intravenous New Bag/Given 02/19/17 1919)  ondansetron (ZOFRAN) injection 4 mg (4 mg Intravenous Given 02/19/17 1919)  ketorolac (TORADOL) 30 MG/ML injection 30 mg (30 mg Intravenous Given 02/19/17 1920)  methylPREDNISolone sodium succinate (SOLU-MEDROL) 125 mg/2 mL injection 125 mg (125 mg Intravenous Given 02/19/17 1920)   cefTRIAXone (ROCEPHIN) 1 g in sodium chloride 0.9 % 100 mL IVPB (0 g Intravenous Stopped 02/19/17 2015)  ipratropium-albuterol (DUONEB) 0.5-2.5 (3) MG/3ML nebulizer solution 3 mL (3 mLs Nebulization Given 02/19/17 1930)  sodium chloride 0.9 % bolus 1,000 mL (1,000 mLs Intravenous New Bag/Given 02/19/17 1919)     Initial Impression / Assessment and Plan / ED Course  I have reviewed the triage vital signs and the nursing notes.  Pertinent labs & imaging results that were available during my care of the patient were reviewed by me and considered in my medical decision making (  see chart for details).     Patient presents with cough, headache, nausea, vomiting, generalized malaise.  Chest x-ray shows bilateral lower lobe pneumonia.  Will hydrate, nebulizer treatment, IV Toradol, IV Rocephin, IV Zithromax.  Admit to general medicine.  Final Clinical Impressions(s) / ED Diagnoses   Final diagnoses:  Community acquired pneumonia, unspecified laterality    ED Discharge Orders    None       Nat Christen, MD 02/19/17 2020    Nat Christen, MD 02/19/17 2020    Nat Christen, MD 02/19/17 2038

## 2017-02-19 NOTE — ED Triage Notes (Signed)
Pt complaining of emesis since this am. Pt states has had two stools today. Pt seen a few weeks ago for cough/congestion.

## 2017-02-20 ENCOUNTER — Other Ambulatory Visit: Payer: Self-pay

## 2017-02-20 DIAGNOSIS — R9431 Abnormal electrocardiogram [ECG] [EKG]: Secondary | ICD-10-CM | POA: Diagnosis present

## 2017-02-20 LAB — CBC WITH DIFFERENTIAL/PLATELET
Basophils Absolute: 0 10*3/uL (ref 0.0–0.1)
Basophils Relative: 0 %
EOS ABS: 0 10*3/uL (ref 0.0–0.7)
EOS PCT: 0 %
HCT: 40.8 % (ref 36.0–46.0)
Hemoglobin: 13.3 g/dL (ref 12.0–15.0)
Lymphocytes Relative: 14 %
Lymphs Abs: 1 10*3/uL (ref 0.7–4.0)
MCH: 29.2 pg (ref 26.0–34.0)
MCHC: 32.6 g/dL (ref 30.0–36.0)
MCV: 89.7 fL (ref 78.0–100.0)
MONO ABS: 0.2 10*3/uL (ref 0.1–1.0)
Monocytes Relative: 3 %
Neutro Abs: 6.2 10*3/uL (ref 1.7–7.7)
Neutrophils Relative %: 83 %
PLATELETS: 207 10*3/uL (ref 150–400)
RBC: 4.55 MIL/uL (ref 3.87–5.11)
RDW: 13 % (ref 11.5–15.5)
WBC: 7.4 10*3/uL (ref 4.0–10.5)

## 2017-02-20 LAB — BASIC METABOLIC PANEL
Anion gap: 11 (ref 5–15)
BUN: 11 mg/dL (ref 6–20)
CALCIUM: 8.8 mg/dL — AB (ref 8.9–10.3)
CO2: 22 mmol/L (ref 22–32)
CREATININE: 0.77 mg/dL (ref 0.44–1.00)
Chloride: 105 mmol/L (ref 101–111)
GFR calc Af Amer: >60 mL/min (ref >60)
GLUCOSE: 307 mg/dL — AB (ref 65–99)
POTASSIUM: 4 mmol/L (ref 3.5–5.1)
SODIUM: 138 mmol/L (ref 135–145)

## 2017-02-20 LAB — STREP PNEUMONIAE URINARY ANTIGEN: STREP PNEUMO URINARY ANTIGEN: NEGATIVE

## 2017-02-20 LAB — HEMOGLOBIN A1C
Hgb A1c MFr Bld: 8.9 % — ABNORMAL HIGH (ref 4.8–5.6)
Mean Plasma Glucose: 208.73 mg/dL

## 2017-02-20 LAB — GLUCOSE, CAPILLARY
Glucose-Capillary: 252 mg/dL — ABNORMAL HIGH (ref 65–99)
Glucose-Capillary: 266 mg/dL — ABNORMAL HIGH (ref 65–99)
Glucose-Capillary: 269 mg/dL — ABNORMAL HIGH (ref 65–99)
Glucose-Capillary: 390 mg/dL — ABNORMAL HIGH (ref 65–99)

## 2017-02-20 LAB — TROPONIN I: Troponin I: <0.03 ng/mL (ref <0.03)

## 2017-02-20 MED ORDER — ACETAMINOPHEN 650 MG RE SUPP: 650.0000 mg | Freq: Four times a day (QID) | RECTAL | Status: DC | PRN

## 2017-02-20 MED ORDER — K PHOS MONO-SOD PHOS DI & MONO 155-852-130 MG PO TABS
500.0000 mg | ORAL_TABLET | Freq: Three times a day (TID) | ORAL | Status: AC
  Administered 2017-02-20 (×3): 500 mg via ORAL
  Filled 2017-02-20 (×3): qty 2

## 2017-02-20 MED ORDER — HYDROCODONE-ACETAMINOPHEN 5-325 MG PO TABS
1.0000 | ORAL_TABLET | Freq: Four times a day (QID) | ORAL | Status: DC | PRN
  Administered 2017-02-20 – 2017-02-21 (×3): 1 via ORAL
  Filled 2017-02-20 (×3): qty 1

## 2017-02-20 MED ORDER — INSULIN ASPART 100 UNIT/ML ~~LOC~~ SOLN
0.0000 [IU] | Freq: Three times a day (TID) | SUBCUTANEOUS | Status: DC
  Administered 2017-02-20 (×2): 11 [IU] via SUBCUTANEOUS
  Administered 2017-02-21: 4 [IU] via SUBCUTANEOUS
  Administered 2017-02-21: 11 [IU] via SUBCUTANEOUS
  Administered 2017-02-21: 4 [IU] via SUBCUTANEOUS

## 2017-02-20 MED ORDER — SODIUM CHLORIDE 0.9 % IV SOLN: 1.0000 g | INTRAVENOUS | Status: DC

## 2017-02-20 MED ORDER — ENOXAPARIN SODIUM 40 MG/0.4ML ~~LOC~~ SOLN
40.0000 mg | SUBCUTANEOUS | Status: DC
  Administered 2017-02-20 (×2): 40 mg via SUBCUTANEOUS
  Filled 2017-02-20 (×2): qty 0.4

## 2017-02-20 MED ORDER — ALPRAZOLAM 0.5 MG PO TABS: 0.5000 mg | ORAL_TABLET | Freq: Three times a day (TID) | ORAL | Status: DC | PRN

## 2017-02-20 MED ORDER — SODIUM CHLORIDE 0.9 % IV SOLN
2.0000 g | INTRAVENOUS | Status: DC
  Filled 2017-02-20: qty 20

## 2017-02-20 MED ORDER — ONDANSETRON HCL 4 MG PO TABS: 4.0000 mg | ORAL_TABLET | Freq: Four times a day (QID) | ORAL | Status: DC | PRN

## 2017-02-20 MED ORDER — PREDNISONE 20 MG PO TABS
50.0000 mg | ORAL_TABLET | Freq: Two times a day (BID) | ORAL | Status: DC
  Administered 2017-02-20 – 2017-02-21 (×4): 50 mg via ORAL
  Filled 2017-02-20 (×4): qty 2

## 2017-02-20 MED ORDER — ONDANSETRON HCL 4 MG/2ML IJ SOLN: 4.0000 mg | Freq: Four times a day (QID) | INTRAMUSCULAR | Status: DC | PRN

## 2017-02-20 MED ORDER — INSULIN ASPART 100 UNIT/ML ~~LOC~~ SOLN
0.0000 [IU] | Freq: Three times a day (TID) | SUBCUTANEOUS | Status: DC
  Administered 2017-02-20: 8 [IU] via SUBCUTANEOUS

## 2017-02-20 MED ORDER — INSULIN ASPART 100 UNIT/ML ~~LOC~~ SOLN
10.0000 [IU] | Freq: Once | SUBCUTANEOUS | Status: AC
  Administered 2017-02-20: 10 [IU] via SUBCUTANEOUS

## 2017-02-20 MED ORDER — SODIUM CHLORIDE 0.9 % IV SOLN
INTRAVENOUS | Status: AC
  Administered 2017-02-20: 02:00:00 via INTRAVENOUS

## 2017-02-20 MED ORDER — GABAPENTIN 300 MG PO CAPS
300.0000 mg | ORAL_CAPSULE | Freq: Two times a day (BID) | ORAL | Status: DC
  Administered 2017-02-20 – 2017-02-21 (×4): 300 mg via ORAL
  Filled 2017-02-20 (×4): qty 1

## 2017-02-20 MED ORDER — SERTRALINE HCL 50 MG PO TABS
100.0000 mg | ORAL_TABLET | Freq: Every day | ORAL | Status: DC
  Administered 2017-02-20 (×2): 100 mg via ORAL
  Filled 2017-02-20 (×2): qty 2

## 2017-02-20 MED ORDER — BISOPROLOL-HYDROCHLOROTHIAZIDE 2.5-6.25 MG PO TABS
1.0000 | ORAL_TABLET | Freq: Every day | ORAL | Status: DC
  Administered 2017-02-20 (×2): 1 via ORAL
  Filled 2017-02-20 (×5): qty 1

## 2017-02-20 MED ORDER — LEVOFLOXACIN IN D5W 500 MG/100ML IV SOLN
500.0000 mg | INTRAVENOUS | Status: DC
  Administered 2017-02-20 – 2017-02-21 (×2): 500 mg via INTRAVENOUS
  Filled 2017-02-20 (×2): qty 100

## 2017-02-20 MED ORDER — GLIMEPIRIDE 2 MG PO TABS
2.0000 mg | ORAL_TABLET | Freq: Every day | ORAL | Status: DC
  Administered 2017-02-20: 2 mg via ORAL
  Filled 2017-02-20: qty 1

## 2017-02-20 MED ORDER — INSULIN ASPART 100 UNIT/ML ~~LOC~~ SOLN
0.0000 [IU] | Freq: Every day | SUBCUTANEOUS | Status: DC
  Administered 2017-02-20: 3 [IU] via SUBCUTANEOUS

## 2017-02-20 MED ORDER — PHENOL 1.4 % MT LIQD
1.0000 | OROMUCOSAL | Status: DC | PRN
  Administered 2017-02-20: 1 via OROMUCOSAL
  Filled 2017-02-20: qty 177

## 2017-02-20 MED ORDER — FUROSEMIDE 20 MG PO TABS: 20.0000 mg | ORAL_TABLET | Freq: Every day | ORAL | Status: DC | PRN

## 2017-02-20 MED ORDER — ACETAMINOPHEN 325 MG PO TABS
650.0000 mg | ORAL_TABLET | Freq: Four times a day (QID) | ORAL | Status: DC | PRN
  Administered 2017-02-20: 650 mg via ORAL
  Filled 2017-02-20: qty 2

## 2017-02-20 MED ORDER — IPRATROPIUM-ALBUTEROL 0.5-2.5 (3) MG/3ML IN SOLN
3.0000 mL | Freq: Two times a day (BID) | RESPIRATORY_TRACT | Status: DC
  Administered 2017-02-21: 3 mL via RESPIRATORY_TRACT
  Filled 2017-02-20 (×2): qty 3

## 2017-02-20 MED ORDER — INSULIN GLARGINE 100 UNIT/ML ~~LOC~~ SOLN
5.0000 [IU] | Freq: Every day | SUBCUTANEOUS | Status: DC
  Administered 2017-02-20: 5 [IU] via SUBCUTANEOUS
  Filled 2017-02-20 (×2): qty 0.05

## 2017-02-20 MED ORDER — IPRATROPIUM-ALBUTEROL 0.5-2.5 (3) MG/3ML IN SOLN
3.0000 mL | Freq: Four times a day (QID) | RESPIRATORY_TRACT | Status: DC
  Administered 2017-02-20: 3 mL via RESPIRATORY_TRACT
  Filled 2017-02-20: qty 3

## 2017-02-20 NOTE — Plan of Care (Signed)
  Education: Knowledge of General Education information will improve 02/20/2017 0127 - Progressing by Cassandria Anger, RN   Clinical Measurements: Ability to maintain clinical measurements within normal limits will improve 02/20/2017 0127 - Progressing by Cassandria Anger, RN Diagnostic test results will improve 02/20/2017 0127 - Progressing by Cassandria Anger, RN   Nutrition: Adequate nutrition will be maintained 02/20/2017 0127 - Progressing by Cassandria Anger, RN   Coping: Level of anxiety will decrease 02/20/2017 0127 - Progressing by Cassandria Anger, RN   Elimination: Will not experience complications related to bowel motility 02/20/2017 0127 - Progressing by Cassandria Anger, RN   Pain Managment: General experience of comfort will improve 02/20/2017 0127 - Progressing by Cassandria Anger, RN   Safety: Ability to remain free from injury will improve 02/20/2017 0127 - Progressing by Cassandria Anger, RN   Skin Integrity: Risk for impaired skin integrity will decrease 02/20/2017 0127 - Progressing by Cassandria Anger, RN

## 2017-02-20 NOTE — Progress Notes (Signed)
PROGRESS NOTE  Kimberly Marks WUX:[id number] DOB: May 27, 1970 DOA: 02/19/2017 PCP: Sharilyn Sites, MD  HPI/Recap of past 67 hours:  47 year old female with past medical history of diabetes mellitus with secondary peripheral neuropathy, obesity and obstructive sleep apnea noncompliant with CPAP admitted on the early morning of 2/19 after presenting with several weeks of worsening cough and congestion and then 1 day of emesis and dyspnea on exertion. Chest x-ray confirmed multilobar pneumonia.  Patient started on IV antibiotics and nebulizers. This morning, she is feeling somewhat better.  Assessment/Plan: Principal Problem:   CAP (community acquired pneumonia): Not hypoxic. Still with some dyspnea with exertion and wheezing. She received IV Rocephin and Zithromax initially, but she had been on a by mouth course of Zithromax she completed, so we'll change to Levaquin. Have added by mouth prednisone for mild wheezing. Continue nebulizers Active Problems:   Hypertension   Diabetes mellitus without complication (HCC) holding oral agents. Sliding scale plus have added low-dose Lantus given that she is now on steroids:    Sleep apnea: Noncompliant with CPAP. There is claustrophobic with mask. I encouraged her to look at alternative masks which might help   Neuropathy   Hypomagnesemia: Replacing   Abnormal EKG: Initial EKG noted inverse T waves in anterolateral leads. Repeat EKG this morning shows the same. Following troponins which were 12 and normal. Maybe her baseline   Hypophosphatemia   Code Status: Full code    Family Communication: Mother at the bedside    Disposition Plan: Likely discharge tomorrow     Consultants:  None    Procedures:  None    Antimicrobials:  IV Rocephin and Zithromax 2/18  IV Levaquin 2/19-present    DVT prophylaxis:  Lovenox    Objective: Vitals:   02/20/17 0008 02/20/17 0100 02/20/17 0351 02/20/17 0930  BP: (!) 106/57 (!) 107/58 122/66   Pulse:  97 95 89   Resp: 19 18 18    Temp: 98.6 F (37 C) 98.2 F (36.8 C) 98.4 F (36.9 C)   TempSrc: Oral Oral Oral   SpO2: 94% 95% 94% 93%  Weight:  109.8 kg (242 lb 1.6 oz)    Height:  5\' 8"  (1.727 m)      Intake/Output Summary (Last 24 hours) at 02/20/2017 1407 Last data filed at 02/20/2017 1020 Gross per 24 hour  Intake 2645 ml  Output 900 ml  Net 1745 ml   Filed Weights   02/19/17 1702 02/20/17 0100  Weight: 111.1 kg (245 lb) 109.8 kg (242 lb 1.6 oz)   Body mass index is 36.81 kg/m.  Exam:   General:  Alert and oriented 3, no acute distress   HEENT: Normocephalic, atraumatic, mucous membranes are slightly dry   Neck: Supple, no JVD    Cardiovascular: Regular rate and rhythm, S1-S2    Respiratory: Decreased breath sounds bibasilar, mild expiratory wheeze bilaterally, breathing not labored    Abdomen: Soft, nontender, nondistended, positive bowel sounds    Musculoskeletal: No clubbing or cyanosis or edema    Skin: No skin breaks, tears or lesions   Psychiatry: Appropriate, no evidence of psychoses   Neuro: No focal deficits     Data Reviewed: CBC: Recent Labs  Lab 02/19/17 1733 02/20/17 0523  WBC 8.0 7.4  NEUTROABS  --  6.2  HGB 14.9 13.3  HCT 44.5 40.8  MCV 87.9 89.7  PLT 200 253   Basic Metabolic Panel: Recent Labs  Lab 02/19/17 1733 02/20/17 0523  NA 136 138  K 4.2  4.0  CL 102 105  CO2 23 22  GLUCOSE 188* 307*  BUN 9 11  CREATININE 0.77 0.77  CALCIUM 9.4 8.8*  MG 1.6*  --   PHOS 2.2*  --    GFR: Estimated Creatinine Clearance: 111.7 mL/min (by C-G formula based on SCr of 0.77 mg/dL). Liver Function Tests: Recent Labs  Lab 02/19/17 1733  AST 62*  ALT 61*  ALKPHOS 114  BILITOT 0.9  PROT 7.8  ALBUMIN 4.2   Recent Labs  Lab 02/19/17 1733  LIPASE 27   No results for input(s): AMMONIA in the last 168 hours. Coagulation Profile: No results for input(s): INR, PROTIME in the last 168 hours. Cardiac Enzymes: Recent Labs  Lab  02/19/17 2256 02/20/17 0523  TROPONINI <0.03 <0.03   BNP (last 3 results) No results for input(s): PROBNP in the last 8760 hours. HbA1C: Recent Labs    02/20/17 0523  HGBA1C 8.9*   CBG: Recent Labs  Lab 02/20/17 0059 02/20/17 0737 02/20/17 1113  GLUCAP 390* 269* 266*   Lipid Profile: No results for input(s): CHOL, HDL, LDLCALC, TRIG, CHOLHDL, LDLDIRECT in the last 72 hours. Thyroid Function Tests: No results for input(s): TSH, T4TOTAL, FREET4, T3FREE, THYROIDAB in the last 72 hours. Anemia Panel: No results for input(s): VITAMINB12, FOLATE, FERRITIN, TIBC, IRON, RETICCTPCT in the last 72 hours. Urine analysis:    Component Value Date/Time   COLORURINE YELLOW 02/19/2017 1706   APPEARANCEUR CLEAR 02/19/2017 1706   LABSPEC 1.008 02/19/2017 1706   PHURINE 7.0 02/19/2017 1706   GLUCOSEU 50 (A) 02/19/2017 1706   HGBUR NEGATIVE 02/19/2017 1706   BILIRUBINUR NEGATIVE 02/19/2017 1706   KETONESUR 5 (A) 02/19/2017 1706   PROTEINUR NEGATIVE 02/19/2017 1706   UROBILINOGEN 1.0 10/15/2014 2140   NITRITE NEGATIVE 02/19/2017 1706   LEUKOCYTESUR NEGATIVE 02/19/2017 1706   Sepsis Labs: @LABRCNTIP (procalcitonin:4,lacticidven:4)  )No results found for this or any previous visit (from the past 240 hour(s)).    Studies: Dg Chest 2 View  Result Date: 02/19/2017 CLINICAL DATA:  Vomiting. Cough and chest congestion. History of diabetes. EXAM: CHEST  2 VIEW COMPARISON:  12/13/2016 FINDINGS: Heart size is normal. Mediastinal shadows are normal. There is central bronchial thickening. There is patchy density in both lower lobes consistent with mild pneumonia. No dense consolidation or lobar collapse. No effusions. Bony structures unremarkable. IMPRESSION: Patchy bilateral lower lobe pneumonia. Electronically Signed   By: Nelson Chimes M.D.   On: 02/19/2017 17:43    Scheduled Meds: . bisoprolol-hydrochlorothiazide  1 tablet Oral QHS  . enoxaparin (LOVENOX) injection  40 mg Subcutaneous Q24H    . gabapentin  300 mg Oral BID  . insulin aspart  0-20 Units Subcutaneous TID WC  . insulin aspart  0-5 Units Subcutaneous QHS  . insulin glargine  5 Units Subcutaneous QHS  . ipratropium-albuterol  3 mL Nebulization BID  . predniSONE  50 mg Oral BID WC  . sertraline  100 mg Oral QHS    Continuous Infusions: . levofloxacin (LEVAQUIN) IV Stopped (02/20/17 1120)     LOS: 1 day     Annita Brod, MD [facility]  To reach me or the doctor on call, go to: [email or web address] Password [facility]  02/20/2017, 2:07 PM

## 2017-02-20 NOTE — H&P (Signed)
4        History and Physical    MODEST DRAEGER DDU:[id number] DOB: [birth year] DOA: 02/19/2017  PCP: Sharilyn Sites, MD   Patient coming from:.  Home.  I have personally briefly reviewed patient's old medical records in [place]  Chief Complaint: Cough, nausea and vomiting.  HPI: Kimberly Marks is a 47 y.o. female with medical history significant of osteoarthritis, type 2 diabetes, hypertension, diabetic peripheral neuropathy, increased BMI, obstructive sleep apnea not using CPAP who is coming to the emergency department with complaints of cough, nausea and about 5-6 episodes of emesis earlier today.  She states that she was seen in a few weeks ago for cough and upper airway congestion.  She also complains of fatigue, malaise, decreased appetite, frontal sinus headache, mild sore throat, but denies wheezing, chest pain, dizziness, palpitations, orthopnea or pitting edema lower extremities.  No diarrhea, constipation, melena or hematochezia.  No dysuria, frequency or hematuria.  Denies polyphagia, polyuria, polydipsia or blurred vision.  No heat or cold intolerance.  She denies travel history or sick contacts to her knowledge.  ED Course: Initial vital signs in the emergency department temperature 38.2C (100.8 F, pulse 116, respirations 20, blood pressure 116/82 mmHg and O2 sat 98% on room air.  Her urinalysis showed mild glucosuria of 50 mg/dL and trace ketonuria of 5 mg/dL.  CBC was normal with a white count of 8.0, hemoglobin 14.9 and platelets 200.  CMP and chemistry showed phosphorus of 2.2, magnesium of 1.6 a glucose of 188 mg/dL, AST of 62 and ALT of 61 U/L.  All other values were normal, including lipase and troponin.  EKG showed marked ST abnormality.  Her chest radiograph show patchy bilateral lower lobe pneumonia.  Medications in the ED: The patient received supplemental oxygen, at 2000 mL normal saline bolus, DuoNeb, Zofran 4 mg IVP x1, Toradol 30 mg IVP x1, ceftriaxone and  azithromycin.  Review of Systems: As per HPI otherwise 10 point review of systems negative.    Past Medical History:  Diagnosis Date  . Arthritis   . Diabetes mellitus without complication ([place])   . Hypertension   . Neuropathy   . Obesity   . Sleep apnea     Past Surgical History:  Procedure Laterality Date  . ABDOMINAL HYSTERECTOMY    . ABDOMINAL SURGERY    . LITHOTRIPSY    . TONSILLECTOMY       reports that she has quit smoking. She smoked 1.00 pack per day. She quit smokeless tobacco use about 4 years ago. She reports that she drinks alcohol. She reports that she does not use drugs.  No Known Allergies  Family History  Problem Relation Age of Onset  . Stroke Mother   . Hypertension Mother   . COPD Mother   . Hyperlipidemia Father   . Diabetes Maternal Grandmother   . Heart disease Maternal Grandmother   . Lung cancer Paternal Grandmother    Prior to Admission medications   Medication Sig Start Date End Date Taking? Authorizing Provider  ALPRAZolam Duanne Moron) 0.5 MG tablet Take 0.5 mg by mouth 3 (three) times daily as needed for anxiety.    Yes [provider]  bisoprolol-hydrochlorothiazide (ZIAC) 2.5-6.25 MG per tablet Take 1 tablet by mouth at bedtime.    Yes [provider]  diphenhydrAMINE (BENADRYL) 25 MG tablet Take 1 tablet (25 mg total) by mouth every 6 (six) hours. 02/05/17  Yes Triplett, Tammy, PA-C  furosemide (LASIX) 20 MG tablet  Take 20 mg daily as needed by mouth for fluid or edema.    Yes [provider]  gabapentin (NEURONTIN) 300 MG capsule Take 1 capsule (300 mg total) 2 (two) times daily by mouth. 11/21/16  Yes Rancour, Annie Main, MD  glimepiride (AMARYL) 2 MG tablet Take 2 mg daily with breakfast by mouth.   Yes [provider]  HYDROcodone-acetaminophen (NORCO/VICODIN) 5-325 MG tablet Take 1 tablet by mouth every 6 (six) hours as needed for moderate pain.   Yes [provider]  sertraline (ZOLOFT) 100 MG  tablet Take 100 mg at bedtime by mouth.    Yes [provider]    Physical Exam: Vitals:   02/19/17 2130 02/19/17 2200 02/20/17 0008 02/20/17 0100  BP: (!) 98/59 96/60 (!) 106/57 (!) 107/58  Pulse: (!) 102 (!) 104 97 95  Resp:   19 18  Temp:   98.6 F (37 C) 98.2 F (36.8 C)  TempSrc:   Oral Oral  SpO2: 96% 95% 94% 95%  Weight:    109.8 kg (242 lb 1.6 oz)  Height:    5\' 8"  (1.727 m)    Constitutional: Looks acutely ill. Eyes: PERRL, lids and conjunctivae normal ENMT: Mucous membranes are dry. Posterior pharynx clear of any exudate or lesions. Neck: normal, supple, no masses, no thyromegaly Respiratory: Decreased breath sounds with rhonchi and wheezing bilaterally, no crackles. Normal respiratory effort. No accessory muscle use.  Cardiovascular: Tachycardic at 102 bpm, no murmurs / rubs / gallops. No extremity edema. 2+ pedal pulses. No carotid bruits.  Abdomen: Obese, soft, no tenderness, no masses palpated. No hepatosplenomegaly. Bowel sounds positive.  Musculoskeletal: no clubbing / cyanosis.  Good ROM, no contractures. Normal muscle tone.  Skin: no significant rashes, lesions, ulcers on limited skin exam. Neurologic: CN 2-12 grossly intact. Sensation intact, DTR normal. Strength 5/5 in all 4.  Psychiatric: Normal judgment and insight. Alert and oriented x 3. Normal mood.    Labs on Admission: I have personally reviewed following labs and imaging studies  CBC: Recent Labs  Lab 02/19/17 1733  WBC 8.0  HGB 14.9  HCT 44.5  MCV 87.9  PLT 381   Basic Metabolic Panel: Recent Labs  Lab 02/19/17 1733  NA 136  K 4.2  CL 102  CO2 23  GLUCOSE 188*  BUN 9  CREATININE 0.77  CALCIUM 9.4  MG 1.6*  PHOS 2.2*   GFR: Estimated Creatinine Clearance: 111.7 mL/min (by C-G formula based on SCr of 0.77 mg/dL). Liver Function Tests: Recent Labs  Lab 02/19/17 1733  AST 62*  ALT 61*  ALKPHOS 114  BILITOT 0.9  PROT 7.8  ALBUMIN 4.2   Recent Labs  Lab  02/19/17 1733  LIPASE 27   No results for input(s): AMMONIA in the last 168 hours. Coagulation Profile: No results for input(s): INR, PROTIME in the last 168 hours. Cardiac Enzymes: Recent Labs  Lab 02/19/17 2256  TROPONINI <0.03   BNP (last 3 results) No results for input(s): PROBNP in the last 8760 hours. HbA1C: No results for input(s): HGBA1C in the last 72 hours. CBG: Recent Labs  Lab 02/20/17 0059  GLUCAP 390*   Lipid Profile: No results for input(s): CHOL, HDL, LDLCALC, TRIG, CHOLHDL, LDLDIRECT in the last 72 hours. Thyroid Function Tests: No results for input(s): TSH, T4TOTAL, FREET4, T3FREE, THYROIDAB in the last 72 hours. Anemia Panel: No results for input(s): VITAMINB12, FOLATE, FERRITIN, TIBC, IRON, RETICCTPCT in the last 72 hours. Urine analysis:    Component Value Date/Time  COLORURINE YELLOW 02/19/2017 [place] 02/19/2017 1706   LABSPEC 1.008 02/19/2017 1706   PHURINE 7.0 02/19/2017 1706   GLUCOSEU 50 (A) 02/19/2017 1706   HGBUR NEGATIVE 02/19/2017 1706   BILIRUBINUR NEGATIVE 02/19/2017 1706   KETONESUR 5 (A) 02/19/2017 1706   PROTEINUR NEGATIVE 02/19/2017 1706   UROBILINOGEN 1.0 10/15/2014 2140   NITRITE NEGATIVE 02/19/2017 1706   LEUKOCYTESUR NEGATIVE 02/19/2017 1706    Radiological Exams on Admission: Dg Chest 2 View  Result Date: 02/19/2017 CLINICAL DATA:  Vomiting. Cough and chest congestion. History of diabetes. EXAM: CHEST  2 VIEW COMPARISON:  12/13/2016 FINDINGS: Heart size is normal. Mediastinal shadows are normal. There is central bronchial thickening. There is patchy density in both lower lobes consistent with mild pneumonia. No dense consolidation or lobar collapse. No effusions. Bony structures unremarkable. IMPRESSION: Patchy bilateral lower lobe pneumonia. Electronically Signed   By: Nelson Chimes M.D.   On: 02/19/2017 17:43    EKG: Independently reviewed.  Vent. rate 115 BPM PR interval 132 ms QRS duration 86  ms QT/QTc 320/442 ms P-R-T axes 55 96 150 Sinus tachycardia Rightward axis Septal infarct , age undetermined Marked ST abnormality, possible lateral subendocardial injury Abnormal ECG  Assessment/Plan Principal Problem:   CAP (community acquired pneumonia) Admit to telemetry/inpatient. Continue supplemental oxygen. DuoNeb every 6 hours. Continue azithromycin 500 mg IVPB every 24 hours. Continue ceftriaxone 2 g IVPB every 24 hours. Check sputum Gram stain, culture and sensitivity. Check strep pneumonia urinary antigen.  Active Problems:   Abnormal EKG No chest pain, but multiple risk factors. Recheck EKG in the morning. Check troponin now and in the morning. Will get echocardiogram given tachycardia and abnormal T waves on EKG. I have advised the patient to get a cardiac stress test in the near future.    Hypomagnesemia Replacing. Follow-up level as needed.    Hypophosphatemia Replacing. Check phosphorus level as needed.    Hypertension Continue bisoprolol 2.5 mg at bedtime. Continue hydrochlorothiazide 6.25 mg p.o. at bedtime. Monitor blood pressure, heart rate, renal function and electrolytes.    Diabetes mellitus without complication (HCC) Carbohydrate modified diet. Continue Amaryl 2 mg p.o. daily. CBG monitoring with regular insulin sliding scale. Check hemoglobin A1c in a.m. The patient was advised to check her feet daily. She has now had a dilated pupil eye exam. Advised to follow-up with PCP for fasting lipids and microalbuminuria screening.    Sleep apnea Currently not using BiPAP.    Neuropathy  Continue gabapentin 300 mg p.o. twice daily.   DVT prophylaxis: Lovenox SQ. Code Status: Full code. Family Communication:  Disposition Plan: Admit for IV antibiotics for 2-3 days. Consults called:  Admission status: Inpatient/telemetry.   Reubin Milan MD [facility] Pager [phone].  If 7PM-7AM, please contact  night-coverage [email or web address] Password [email or web address]  02/20/2017, 1:22 AM

## 2017-02-20 NOTE — Plan of Care (Signed)
Patient was educated on stair ambulation and demonstrated safe ability to ascend/descend 6 steps with 1 hand rail to be able to enter home when discharged.

## 2017-02-20 NOTE — Progress Notes (Signed)
Respiratory Care Note: EKG done and give to patient's RN.

## 2017-02-20 NOTE — Progress Notes (Signed)
Inpatient Diabetes Program Recommendations  AACE/ADA: New Consensus Statement on Inpatient Glycemic Control (2015)  Target Ranges:  Prepandial:   less than 140 mg/dL      Peak postprandial:   less than 180 mg/dL (1-2 hours)      Critically ill patients:  140 - 180 mg/dL   Results for AL, GAGEN (MRN [id number]) as of 02/20/2017 09:04  Ref. Range 02/20/2017 00:59 02/20/2017 07:37  Glucose-Capillary Latest Ref Range: 65 - 99 mg/dL 390 (H) 269 (H)    Review of Glycemic Control  Diabetes history: DM2 Outpatient Diabetes medications: Amaryl 2 mg QAM Current orders for Inpatient glycemic control: Amaryl 2 mg QAM, Novolog 0-15 units TID with meals  Inpatient Diabetes Program Recommendations: Correction (SSI): Please consider ordering Novolog 0-5 units QHS for bedtime correction scale. A1C: A1C in process.  NOTE: Patient received one time Solumedrol 125 mg on 02/19/17 which is contributing to hyperglycemia. No other steroids ordered at this time.   Thanks, Barnie Alderman, RN, MSN, CDE Diabetes Coordinator Inpatient Diabetes Program [phone] (Team Pager from 8am to 5pm)

## 2017-02-20 NOTE — Evaluation (Signed)
Physical Therapy Evaluation Patient Details Name: Kimberly Marks MRN: [id number] DOB: 03/13/1970 Today's Date: 02/20/2017   History of Present Illness    Kimberly Marks is a 47 y.o. female with medical history significant of osteoarthritis, type 2 diabetes, hypertension, diabetic peripheral neuropathy, increased BMI, obstructive sleep apnea not using CPAP who is coming to the emergency department with complaints of cough, nausea and about 5-6 episodes of emesis earlier today. She states that she was seen in a few weeks ago for cough and upper airway congestion. She also complains of fatigue, malaise, decreased appetite, frontal sinus headache, mild sore throat, but denies wheezing, chest pain, dizziness, palpitations, orthopnea or pitting edema lower extremities. No diarrhea, constipation, melena or hematochezia. No dysuria, frequency or hematuria. Denies polyphagia, polyuria, polydipsia or blurred vision. No heat or cold intolerance. She denies travel history or sick contacts to her knowledge.   Clinical Impression  Kimberly Marks is a 48 y.o. presenting for PT evaluation with recent decrease in functional mobility secondary to respiratory difficulties.  She is currently functioning close to her baseline of independent with no device, for transfers/gait and has slightly decreased activity tolerance compared to PLOF. She has demonstrated safe ambulation on level surface with no device and safe stair ambulation to ascend/descend 6 steps to return home safely. Patient is ready to be discharged from Acute PT and has been educated on benefits of mobilizing with RN 3x/day to improve activity tolerance. Anticipate patient will be able to return home safely when medically ready. Re-consult Acute PT if change in functinoal status.      Follow Up Recommendations No PT follow up    Equipment Recommendations  None recommended by PT    Recommendations for Other Services       Precautions / Restrictions  Restrictions Weight Bearing Restrictions: No      Mobility  Bed Mobility Overal bed mobility: Independent     Transfers Overall transfer level: Independent     Ambulation/Gait Ambulation/Gait assistance: Supervision Ambulation Distance (Feet): 120 Feet Assistive device: None Gait Pattern/deviations: WFL(Within Functional Limits)   Gait velocity interpretation: Below normal speed for age/gender General Gait Details: slightly unsteady but no overt LOB  Stairs Stairs: Yes Stairs assistance: Supervision Stair Management: One rail Right;Alternating pattern Number of Stairs: 6        Balance Overall balance assessment: Modified Independent Sitting-balance support: Bilateral upper extremity supported;Feet supported Sitting balance-Leahy Scale: Normal     Standing balance support: No upper extremity supported Standing balance-Leahy Scale: Good Standing balance comment: supervision for safety with dynamic standign balance Single Leg Stance - Right Leg: 5 Single Leg Stance - Left Leg: 5         High level balance activites: Head turns;Turns High Level Balance Comments: no change in gait speed and no overt LOB during high level gait activities          Pertinent Vitals/Pain Pain Assessment: No/denies pain    Home Living Family/patient expects to be discharged to:: Private residence Living Arrangements: Spouse/significant other Available Help at Discharge: Friend(s) Type of Home: House Home Access: Stairs to enter Entrance Stairs-Rails: Can reach both Entrance Stairs-Number of Steps: 6 Home Layout: One level Home Equipment: None Additional Comments: patient is looking into purchasing a shower bench    Prior Function Level of Independence: Independent    Comments: requires extra time for mobility and is limited by back pain     Hand Dominance       Extremity/Trunk Assessment  Upper Extremity Assessment Upper Extremity Assessment: Overall WFL for  tasks assessed    Lower Extremity Assessment Lower Extremity Assessment: Overall WFL for tasks assessed    Cervical / Trunk Assessment Cervical / Trunk Assessment: Normal  Communication   Communication: No difficulties  Cognition Arousal/Alertness: Awake/alert Behavior During Therapy: WFL for tasks assessed/performed Overall Cognitive Status: Within Functional Limits for tasks assessed                  Assessment/Plan    PT Assessment Patent does not need any further PT services  PT Problem List         PT Treatment Interventions      PT Goals (Current goals can be found in the Care Plan section)  Acute Rehab PT Goals Patient Stated Goal: get stronger and go back home PT Goal Formulation: With patient Time For Goal Achievement: 02/23/17 Potential to Achieve Goals: Good     AM-PAC PT "6 Clicks" Daily Activity  Outcome Measure Difficulty turning over in bed (including adjusting bedclothes, sheets and blankets)?: None Difficulty moving from lying on back to sitting on the side of the bed? : None Difficulty sitting down on and standing up from a chair with arms (e.g., wheelchair, bedside commode, etc,.)?: None Help needed moving to and from a bed to chair (including a wheelchair)?: A Little Help needed walking in hospital room?: A Little Help needed climbing 3-5 steps with a railing? : A Little 6 Click Score: 21    End of Session   Activity Tolerance: Patient tolerated treatment well Patient left: in bed;with call bell/phone within reach(with tray table in front) Nurse Communication: Mobility status PT Visit Diagnosis: Unsteadiness on feet (R26.81);Muscle weakness (generalized) (M62.81);Other abnormalities of gait and mobility (R26.89)    Time: 1937-9024 PT Time Calculation (min) (ACUTE ONLY): 26 min   Charges:   PT Evaluation $PT Eval Low Complexity: 1 Low PT Treatments $Gait Training: 8-22 mins   Kipp Brood, PT, DPT Physical Therapist with [facility]  02/20/2017 4:18 PM

## 2017-02-21 ENCOUNTER — Inpatient Hospital Stay (HOSPITAL_COMMUNITY): Payer: Self-pay

## 2017-02-21 ENCOUNTER — Other Ambulatory Visit (HOSPITAL_COMMUNITY): Payer: Self-pay

## 2017-02-21 DIAGNOSIS — E1159 Type 2 diabetes mellitus with other circulatory complications: Secondary | ICD-10-CM

## 2017-02-21 DIAGNOSIS — E669 Obesity, unspecified: Secondary | ICD-10-CM

## 2017-02-21 DIAGNOSIS — G629 Polyneuropathy, unspecified: Secondary | ICD-10-CM

## 2017-02-21 DIAGNOSIS — E119 Type 2 diabetes mellitus without complications: Secondary | ICD-10-CM

## 2017-02-21 DIAGNOSIS — J181 Lobar pneumonia, unspecified organism: Secondary | ICD-10-CM

## 2017-02-21 DIAGNOSIS — G4733 Obstructive sleep apnea (adult) (pediatric): Secondary | ICD-10-CM

## 2017-02-21 DIAGNOSIS — E1169 Type 2 diabetes mellitus with other specified complication: Secondary | ICD-10-CM

## 2017-02-21 DIAGNOSIS — I10 Essential (primary) hypertension: Secondary | ICD-10-CM

## 2017-02-21 LAB — GLUCOSE, CAPILLARY
GLUCOSE-CAPILLARY: 194 mg/dL — AB (ref 65–99)
Glucose-Capillary: 218 mg/dL — ABNORMAL HIGH (ref 65–99)
Glucose-Capillary: 169 mg/dL — ABNORMAL HIGH (ref 65–99)
Glucose-Capillary: 275 mg/dL — ABNORMAL HIGH (ref 65–99)

## 2017-02-21 LAB — BASIC METABOLIC PANEL
Anion gap: 9 (ref 5–15)
BUN: 16 mg/dL (ref 6–20)
CALCIUM: 8.9 mg/dL (ref 8.9–10.3)
CO2: 23 mmol/L (ref 22–32)
CREATININE: 0.79 mg/dL (ref 0.44–1.00)
Chloride: 104 mmol/L (ref 101–111)
GFR calc Af Amer: >60 mL/min (ref >60)
GFR calc non Af Amer: >60 mL/min (ref >60)
GLUCOSE: 259 mg/dL — AB (ref 65–99)
Potassium: 3.9 mmol/L (ref 3.5–5.1)
Sodium: 136 mmol/L (ref 135–145)

## 2017-02-21 LAB — CBC WITH DIFFERENTIAL/PLATELET
BASOS PCT: 0 %
Basophils Absolute: 0 10*3/uL (ref 0.0–0.1)
Eosinophils Absolute: 0 10*3/uL (ref 0.0–0.7)
Eosinophils Relative: 0 %
HEMATOCRIT: 41.8 % (ref 36.0–46.0)
Hemoglobin: 13.5 g/dL (ref 12.0–15.0)
Lymphocytes Relative: 14 %
Lymphs Abs: 2.3 10*3/uL (ref 0.7–4.0)
MCH: 29.2 pg (ref 26.0–34.0)
MCHC: 32.3 g/dL (ref 30.0–36.0)
MCV: 90.5 fL (ref 78.0–100.0)
MONO ABS: 1.2 10*3/uL — AB (ref 0.1–1.0)
MONOS PCT: 7 %
NEUTROS ABS: 13.7 10*3/uL — AB (ref 1.7–7.7)
Neutrophils Relative %: 79 %
Platelets: 246 10*3/uL (ref 150–400)
RBC: 4.62 MIL/uL (ref 3.87–5.11)
RDW: 13.5 % (ref 11.5–15.5)
WBC: 17.2 10*3/uL — ABNORMAL HIGH (ref 4.0–10.5)

## 2017-02-21 LAB — ECHOCARDIOGRAM COMPLETE
Height: 68 in
Weight: 3878.4 oz

## 2017-02-21 LAB — TROPONIN I: Troponin I: <0.03 ng/mL (ref <0.03)

## 2017-02-21 MED ORDER — IPRATROPIUM-ALBUTEROL 20-100 MCG/ACT IN AERS: 1.0000 | INHALATION_SPRAY | Freq: Four times a day (QID) | RESPIRATORY_TRACT | 1 refills | Status: AC | PRN

## 2017-02-21 MED ORDER — PREDNISONE 20 MG PO TABS: ORAL_TABLET | ORAL | 0 refills | Status: DC

## 2017-02-21 MED ORDER — SERTRALINE HCL 100 MG PO TABS: 100.0000 mg | ORAL_TABLET | Freq: Every day | ORAL | 1 refills | Status: DC

## 2017-02-21 MED ORDER — GLIMEPIRIDE 2 MG PO TABS: 2.0000 mg | ORAL_TABLET | Freq: Every day | ORAL | 1 refills | Status: DC

## 2017-02-21 MED ORDER — GUAIFENESIN ER 600 MG PO TB12: 600.0000 mg | ORAL_TABLET | Freq: Two times a day (BID) | ORAL | 0 refills | Status: AC

## 2017-02-21 MED ORDER — GABAPENTIN 300 MG PO CAPS: 300.0000 mg | ORAL_CAPSULE | Freq: Two times a day (BID) | ORAL | 1 refills | Status: AC

## 2017-02-21 MED ORDER — BISOPROLOL-HYDROCHLOROTHIAZIDE 2.5-6.25 MG PO TABS: 1.0000 | ORAL_TABLET | Freq: Every day | ORAL | 1 refills | Status: AC

## 2017-02-21 MED ORDER — LEVOFLOXACIN 500 MG PO TABS: 500.0000 mg | ORAL_TABLET | Freq: Every day | ORAL | 0 refills | Status: AC

## 2017-02-21 NOTE — Progress Notes (Signed)
Inpatient Diabetes Program Recommendations  AACE/ADA: New Consensus Statement on Inpatient Glycemic Control (2015)  Target Ranges:  Prepandial:   less than 140 mg/dL      Peak postprandial:   less than 180 mg/dL (1-2 hours)      Critically ill patients:  140 - 180 mg/dL  Results for KASSY, MCENROE (MRN [id number]) as of 02/21/2017 09:32  Ref. Range 02/20/2017 07:37 02/20/2017 11:13 02/20/2017 16:25 02/20/2017 20:57 02/21/2017 07:51  Glucose-Capillary Latest Ref Range: 65 - 99 mg/dL 269 (H)  Novolog 8 units  Lantus 5 units 266 (H)  Novolog 11 units 252 (H)  Novolog 11 units 218 (H)  Novolog 3 units 169 (H)  Novolog 4 units   Results for ABRIEL, HATTERY (MRN [id number]) as of 02/21/2017 09:32  Ref. Range 02/20/2017 05:23  Hemoglobin A1C Latest Ref Range: 4.8 - 5.6 % 8.9 (H)   Review of Glycemic Control  Diabetes history: DM2 Outpatient Diabetes medications: Amaryl 2 mg QAM Current orders for Inpatient glycemic control: Lantus 5 units QAM, Novolog 0-20 units TID with meals, Novolog 0-5 units QHS  Inpatient Diabetes Program Recommendations: Insulin - Meal Coverage: If steroids are continued, please consider ordering Novolog 5 units TID with meals for meal coverage if patient eats at least 50% of meals.  Thanks, Barnie Alderman, RN, MSN, CDE Diabetes Coordinator Inpatient Diabetes Program [phone] (Team Pager from 8am to 5pm)

## 2017-02-21 NOTE — Discharge Summary (Signed)
Physician Discharge Summary  Kimberly Marks NWG:[id number] DOB: [birth year] DOA: 02/19/2017  PCP: Sharilyn Sites, MD  Admit date: 02/19/2017 Discharge date: 02/21/2017  Time spent: 35 minutes  Recommendations for Outpatient Follow-up:  Patient will benefit of outpatient PFTs and initiation of treatment if needed for COPD. Repeat Chest x-ray in 4-6 weeks to assure resolution of lungs infiltrates Repeat BMET to follow electrolytes and renal function  Close follow up to her CBG's and diabetes, further adjust hypoglycemic regimen as needed.   Discharge Diagnoses:  Principal Problem:   CAP (community acquired pneumonia) Active Problems:   Hypertension   Diabetes mellitus without complication ([place])   Sleep apnea   Neuropathy   Hypomagnesemia   Abnormal EKG   Hypophosphatemia   Obesity, diabetes, and hypertension syndrome ([place])   Discharge Condition: Stable and improved.  Patient discharged home with instruction to follow-up with her PCP in 10 days.  Diet recommendation: Modified carbohydrate, low-calorie and heart healthy/low-sodium diet  Filed Weights   02/19/17 1702 02/20/17 0100 02/21/17 0300  Weight: 111.1 kg (245 lb) 109.8 kg (242 lb 1.6 oz) 110 kg (242 lb 6.4 oz)    History of present illness:  As per H&P written by Dr. Olevia Bowens on 02/20/17 47 y.o. female with medical history significant of osteoarthritis, type 2 diabetes, hypertension, diabetic peripheral neuropathy, increased BMI, obstructive sleep apnea not using CPAP who is coming to the emergency department with complaints of cough, nausea and about 5-6 episodes of emesis earlier today.  She states that she was seen in a few weeks ago for cough and upper airway congestion.  She also complains of fatigue, malaise, decreased appetite, frontal sinus headache, mild sore throat, but denies wheezing, chest pain, dizziness, palpitations, orthopnea or pitting edema lower extremities.  No diarrhea, constipation, melena or hematochezia.  No  dysuria, frequency or hematuria.  Denies polyphagia, polyuria, polydipsia or blurred vision.  No heat or cold intolerance.  She denies travel history or sick contacts to her knowledge.  Patient found with patchy bilateral lower lobe pneumonia; admitted for further evaluation and management of CAP.  Hospital Course:  1-community-acquired pneumonia: -Patient currently afebrile and with good oxygen saturation on room air. -Plan is to discharge on oral Levaquin in order to complete antibiotic treatment -Patient will also use Mucinex and PRN Combivent -Follow up with PCP and repeat CXR in 4-6 weeks to assure resolution of lung infiltrates.  2-HTN -stable overall  -resume home antihypertensive regimen -advise to follow low sodium diet   3-DM type 2: with neuropathy -continue Amaryl -advise to follow low carb diet -continue Neurontin    4-OSA -encourage to be compliant with CPAP  5-abnormal EKG (lateral leads with inverse T waves -neg troponin -no telemetry abnormalities -2-D echo reassuring  -CP free at discharge  6-Class 2 obesity: -Body mass index is 36.86 kg/m. -Low calorie diet has been recommended along with increasing exercise  Procedures:  2D echo: Normal ejection fraction, no wall motion abnormality mild left ventricular hypertrophy appreciated.  Consultations:  None  Discharge Exam: Vitals:   02/21/17 0300 02/21/17 0749  BP: 123/63   Pulse: 61   Resp: 18   Temp: 98.3 F (36.8 C)   SpO2: 96% 95%    General: Afebrile, no chest pain, good oxygen saturation on room air. Cardiovascular: S1 and S2, no rubs, no gallops, no JVD Respiratory: Normal respiratory effort, mild expiratory wheezing, able to speak in full sentences and no using accessory muscles. Abdomen: Soft, nontender, nondistended, positive bowel sounds Extremities: No  edema, no cyanosis or clubbing Neurologic exam: Cranial nerve grossly intact, no focal motor or neurologic deficit appreciated on  exam.  Discharge Instructions   Discharge Instructions    Diet - low sodium heart healthy   Complete by:  As directed    Discharge instructions   Complete by:  As directed    Keep yourself well-hydrated Take medications as prescribed Avoid secondhand smoking Make sure to follow-up with primary care physician (and oriented to establish care and to continue receiving assistance from medications) Follow a heart healthy/low calorie diet     Allergies as of 02/21/2017   No Known Allergies     Medication List    STOP taking these medications   diphenhydrAMINE 25 MG tablet Commonly known as:  BENADRYL   furosemide 20 MG tablet Commonly known as:  LASIX     TAKE these medications   ALPRAZolam 0.5 MG tablet Commonly known as:  XANAX Take 0.5 mg by mouth 3 (three) times daily as needed for anxiety.   bisoprolol-hydrochlorothiazide 2.5-6.25 MG tablet Commonly known as:  ZIAC Take 1 tablet by mouth at bedtime.   gabapentin 300 MG capsule Commonly known as:  NEURONTIN Take 1 capsule (300 mg total) by mouth 2 (two) times daily.   glimepiride 2 MG tablet Commonly known as:  AMARYL Take 1 tablet (2 mg total) by mouth daily with breakfast.   guaiFENesin 600 MG 12 hr tablet Commonly known as:  MUCINEX Take 1 tablet (600 mg total) by mouth 2 (two) times daily for 15 days.   HYDROcodone-acetaminophen 5-325 MG tablet Commonly known as:  NORCO/VICODIN Take 1 tablet by mouth every 6 (six) hours as needed for moderate pain.   Ipratropium-Albuterol 20-100 MCG/ACT Aers respimat Commonly known as:  COMBIVENT RESPIMAT Inhale 1 puff into the lungs every 6 (six) hours as needed for wheezing or shortness of breath.   levofloxacin 500 MG tablet Commonly known as:  LEVAQUIN Take 1 tablet (500 mg total) by mouth daily for 7 days.   predniSONE 20 MG tablet Commonly known as:  DELTASONE Take 2 tablets by mouth daily times 3 days; then 1 tablet by mouth daily times 3 days; then half  tablet by mouth daily times 3 days and stop prednisone.   sertraline 100 MG tablet Commonly known as:  ZOLOFT Take 1 tablet (100 mg total) by mouth at bedtime.      No Known Allergies   The results of significant diagnostics from this hospitalization (including imaging, microbiology, ancillary and laboratory) are listed below for reference.    Significant Diagnostic Studies: Dg Chest 2 View  Result Date: 02/19/2017 CLINICAL DATA:  Vomiting. Cough and chest congestion. History of diabetes. EXAM: CHEST  2 VIEW COMPARISON:  12/13/2016 FINDINGS: Heart size is normal. Mediastinal shadows are normal. There is central bronchial thickening. There is patchy density in both lower lobes consistent with mild pneumonia. No dense consolidation or lobar collapse. No effusions. Bony structures unremarkable. IMPRESSION: Patchy bilateral lower lobe pneumonia. Electronically Signed   By: Nelson Chimes M.D.   On: 02/19/2017 17:43   Labs: Basic Metabolic Panel: Recent Labs  Lab 02/19/17 1733 02/20/17 0523 02/21/17 0417  NA 136 138 136  K 4.2 4.0 3.9  CL 102 105 104  CO2 23 22 23   GLUCOSE 188* 307* 259*  BUN 9 11 16   CREATININE 0.77 0.77 0.79  CALCIUM 9.4 8.8* 8.9  MG 1.6*  --   --   PHOS 2.2*  --   --  Liver Function Tests: Recent Labs  Lab 02/19/17 1733  AST 62*  ALT 61*  ALKPHOS 114  BILITOT 0.9  PROT 7.8  ALBUMIN 4.2   Recent Labs  Lab 02/19/17 1733  LIPASE 27   CBC: Recent Labs  Lab 02/19/17 1733 02/20/17 0523 02/21/17 0417  WBC 8.0 7.4 17.2*  NEUTROABS  --  6.2 13.7*  HGB 14.9 13.3 13.5  HCT 44.5 40.8 41.8  MCV 87.9 89.7 90.5  PLT 200 207 246   Cardiac Enzymes: Recent Labs  Lab 02/19/17 2256 02/20/17 0523 02/21/17 0417  TROPONINI <0.03 <0.03 <0.03   BNP: BNP (last 3 results) Recent Labs    12/13/16 2054  BNP 153.0*   CBG: Recent Labs  Lab 02/20/17 1625 02/20/17 2057 02/21/17 0751 02/21/17 1104 02/21/17 1625  GLUCAP 252* 218* 169* 194* 275*     Signed:  Barton Dubois MD.  [facility] 02/21/2017, 4:49 PM

## 2017-02-21 NOTE — Progress Notes (Signed)
Patient IV removed, Telemetry removed, tolerated well. Patient given discharge instructions at bedside.  

## 2017-02-21 NOTE — Care Management Note (Signed)
Case Management Note  Patient Details  Name: Kimberly Marks MRN: [id number] Date of Birth: Apr 26, 1970  Subjective/Objective:     Admitted with CAP. Pt is from home, ind pta. She has no insurance, is not employed and has PCP. Pt planning to go to [place] for hospital f/u.                Action/Plan: Pt given MATCH to help with medications at DC. Provided with list of all options for establishing care in [facility] with no insurance.   Expected Discharge Date:      02/21/2017            Expected Discharge Plan:  Home/Self Care  In-House Referral:  NA  Discharge planning Services  CM Consult, Other - See comment, MATCH Program  Status of Service:  Completed, signed off  Sherald Barge, RN 02/21/2017, 11:39 AM

## 2017-02-21 NOTE — Progress Notes (Signed)
*  PRELIMINARY RESULTS* Echocardiogram 2D Echocardiogram has been performed.  [name] 02/21/2017, 11:19 AM

## 2017-04-11 ENCOUNTER — Other Ambulatory Visit: Payer: Self-pay

## 2017-04-11 ENCOUNTER — Encounter (HOSPITAL_COMMUNITY): Payer: Self-pay | Admitting: Emergency Medicine

## 2017-04-11 ENCOUNTER — Emergency Department (HOSPITAL_COMMUNITY)
Admission: EM | Admit: 2017-04-11 | Discharge: 2017-04-11 | Disposition: A | Discharge status: Home / Self Care | Payer: Self-pay | Attending: Emergency Medicine | Admitting: Emergency Medicine

## 2017-04-11 DIAGNOSIS — Z87891 Personal history of nicotine dependence: Secondary | ICD-10-CM | POA: Insufficient documentation

## 2017-04-11 DIAGNOSIS — Z79899 Other long term (current) drug therapy: Secondary | ICD-10-CM | POA: Insufficient documentation

## 2017-04-11 DIAGNOSIS — I10 Essential (primary) hypertension: Secondary | ICD-10-CM | POA: Insufficient documentation

## 2017-04-11 DIAGNOSIS — H5713 Ocular pain, bilateral: Secondary | ICD-10-CM | POA: Insufficient documentation

## 2017-04-11 DIAGNOSIS — E119 Type 2 diabetes mellitus without complications: Secondary | ICD-10-CM | POA: Insufficient documentation

## 2017-04-11 DIAGNOSIS — H5789 Other specified disorders of eye and adnexa: Secondary | ICD-10-CM

## 2017-04-11 MED ORDER — TETRACAINE HCL 0.5 % OP SOLN
1.0000 [drp] | Freq: Once | OPHTHALMIC | Status: AC
  Administered 2017-04-11: 1 [drp] via OPHTHALMIC
  Filled 2017-04-11: qty 4

## 2017-04-11 NOTE — ED Provider Notes (Signed)
[facility] EMERGENCY DEPARTMENT Provider Note   CSN: [id number] Arrival date & time: 04/11/17  1456     History   Chief Complaint Chief Complaint  Patient presents with  . Eye Pain    HPI Kimberly Marks is a 47 y.o. female.  The history is provided by the patient. No language interpreter was used.  Eye Pain  This is a chronic problem. Episode onset: over 2 months. The problem occurs constantly. The problem has been gradually worsening. Nothing relieves the symptoms. She has tried nothing for the symptoms. The treatment provided no relief.  Pt reports she has had dry eyes for several months.  Pt reports worse when air blows in her eyes. Pt unable to see eye doctor for 2 weeks.   Past Medical History:  Diagnosis Date  . Arthritis   . Diabetes mellitus without complication ([place])   . Hypertension   . Neuropathy   . Obesity   . Sleep apnea     Patient Active Problem List   Diagnosis Date Noted  . Obesity, diabetes, and hypertension syndrome ([place])   . Hypomagnesemia 02/20/2017  . Abnormal EKG 02/20/2017  . Hypophosphatemia 02/20/2017  . CAP (community acquired pneumonia) 02/19/2017  . Hypertension 02/19/2017  . Diabetes mellitus without complication ([place]) 09/32/6712  . Sleep apnea 02/19/2017  . Neuropathy 02/19/2017  . Prediabetes 01/29/2014  . Obesity 01/29/2014  . BACK PAIN 05/20/2008    Past Surgical History:  Procedure Laterality Date  . ABDOMINAL HYSTERECTOMY    . ABDOMINAL SURGERY    . LITHOTRIPSY    . TONSILLECTOMY       OB History    Gravida  1   Para  1   Term  1   Preterm      AB      Living        SAB      TAB      Ectopic      Multiple      Live Births               Home Medications    Prior to Admission medications   Medication Sig Start Date End Date Taking? Authorizing Provider  ALPRAZolam Duanne Moron) 0.5 MG tablet Take 0.5 mg by mouth 3 (three) times daily as needed for anxiety.     [provider]    bisoprolol-hydrochlorothiazide (ZIAC) 2.5-6.25 MG tablet Take 1 tablet by mouth at bedtime. 02/21/17   Barton Dubois, MD  gabapentin (NEURONTIN) 300 MG capsule Take 1 capsule (300 mg total) by mouth 2 (two) times daily. 02/21/17   Barton Dubois, MD  glimepiride (AMARYL) 2 MG tablet Take 1 tablet (2 mg total) by mouth daily with breakfast. 02/21/17   Barton Dubois, MD  HYDROcodone-acetaminophen (NORCO/VICODIN) 5-325 MG tablet Take 1 tablet by mouth every 6 (six) hours as needed for moderate pain.    [provider]  Ipratropium-Albuterol (COMBIVENT RESPIMAT) 20-100 MCG/ACT AERS respimat Inhale 1 puff into the lungs every 6 (six) hours as needed for wheezing or shortness of breath. 02/21/17   Barton Dubois, MD  predniSONE (DELTASONE) 20 MG tablet Take 2 tablets by mouth daily times 3 days; then 1 tablet by mouth daily times 3 days; then half tablet by mouth daily times 3 days and stop prednisone. 02/21/17   Barton Dubois, MD  sertraline (ZOLOFT) 100 MG tablet Take 1 tablet (100 mg total) by mouth at bedtime. 02/21/17   Barton Dubois, MD    Family History  Family History  Problem Relation Age of Onset  . Stroke Mother   . Hypertension Mother   . COPD Mother   . Hyperlipidemia Father   . Diabetes Maternal Grandmother   . Heart disease Maternal Grandmother   . Lung cancer Paternal Grandmother     Social History Social History   Tobacco Use  . Smoking status: Former Smoker    Packs/day: 1.00  . Smokeless tobacco: Former Systems developer    Quit date: 11/24/2012  Substance Use Topics  . Alcohol use: Yes    Alcohol/week: 0.0 oz    Comment: occasionally  . Drug use: No     Allergies   Patient has no known allergies.   Review of Systems Review of Systems  Eyes: Positive for pain.  All other systems reviewed and are negative.    Physical Exam Updated Vital Signs BP (!) 142/92 (BP Location: Right Arm)   Pulse 69   Temp 98.6 F (37 C) (Oral)   Resp 18   Ht 5\' 8"  (1.727 m)    Wt 109.3 kg (241 lb)   SpO2 95%   BMI 36.64 kg/m   Physical Exam  Constitutional: She is oriented to person, place, and time. She appears well-developed and well-nourished.  HENT:  Head: Normocephalic.  Eyes: EOM are normal.  Neck: Normal range of motion.  Pulmonary/Chest: Effort normal.  Abdominal: She exhibits no distension.  Musculoskeletal: Normal range of motion.  Neurological: She is alert and oriented to person, place, and time.  Psychiatric: She has a normal mood and affect.  Nursing note and vitals reviewed.    ED Treatments / Results  Labs (all labs ordered are listed, but only abnormal results are displayed) Labs Reviewed - No data to display  EKG None  Radiology No results found.  Procedures Procedures (including critical care time)  Medications Ordered in ED Medications  tetracaine (PONTOCAINE) 0.5 % ophthalmic solution 1 drop (has no administration in time range)     Initial Impression / Assessment and Plan / ED Course  I have reviewed the triage vital signs and the nursing notes.  Pertinent labs & imaging results that were available during my care of the patient were reviewed by me and considered in my medical decision making (see chart for details).     Pt advised to use Systane or Refresh.  Pt advised to see Opthomology for evaltuion.    Final Clinical Impressions(s) / ED Diagnoses   Final diagnoses:  Eye irritation    ED Discharge Orders    None    An After Visit Summary was printed and given to the patient.    Fransico Meadow, [place] 04/11/17 1641    Margette Fast, MD 04/11/17 [phone]

## 2017-04-11 NOTE — Discharge Instructions (Addendum)
Try Systane or Refresh eye drops.  Schedule to see Opthalmology for evaluation

## 2017-04-11 NOTE — ED Triage Notes (Signed)
PT c/o bilateral eye pain and sensitivity to light worsening over the past few months. PT denies any eye drainage.

## 2017-12-11 ENCOUNTER — Encounter (HOSPITAL_COMMUNITY): Payer: Self-pay | Admitting: *Deleted

## 2017-12-11 ENCOUNTER — Other Ambulatory Visit: Payer: Self-pay

## 2017-12-11 ENCOUNTER — Emergency Department (HOSPITAL_COMMUNITY)
Admission: EM | Admit: 2017-12-11 | Discharge: 2017-12-11 | Disposition: A | Discharge status: Home / Self Care | Payer: Self-pay | Attending: Emergency Medicine | Admitting: Emergency Medicine

## 2017-12-11 ENCOUNTER — Emergency Department (HOSPITAL_COMMUNITY): Payer: Self-pay

## 2017-12-11 ENCOUNTER — Encounter (HOSPITAL_COMMUNITY): Payer: Self-pay

## 2017-12-11 DIAGNOSIS — I10 Essential (primary) hypertension: Secondary | ICD-10-CM | POA: Insufficient documentation

## 2017-12-11 DIAGNOSIS — E119 Type 2 diabetes mellitus without complications: Secondary | ICD-10-CM | POA: Insufficient documentation

## 2017-12-11 DIAGNOSIS — Z87891 Personal history of nicotine dependence: Secondary | ICD-10-CM | POA: Insufficient documentation

## 2017-12-11 DIAGNOSIS — F410 Panic disorder [episodic paroxysmal anxiety] without agoraphobia: Secondary | ICD-10-CM

## 2017-12-11 DIAGNOSIS — E1165 Type 2 diabetes mellitus with hyperglycemia: Secondary | ICD-10-CM | POA: Insufficient documentation

## 2017-12-11 DIAGNOSIS — R739 Hyperglycemia, unspecified: Secondary | ICD-10-CM

## 2017-12-11 DIAGNOSIS — Z79899 Other long term (current) drug therapy: Secondary | ICD-10-CM | POA: Insufficient documentation

## 2017-12-11 DIAGNOSIS — F419 Anxiety disorder, unspecified: Secondary | ICD-10-CM | POA: Insufficient documentation

## 2017-12-11 DIAGNOSIS — Z7984 Long term (current) use of oral hypoglycemic drugs: Secondary | ICD-10-CM | POA: Insufficient documentation

## 2017-12-11 DIAGNOSIS — R0602 Shortness of breath: Secondary | ICD-10-CM | POA: Insufficient documentation

## 2017-12-11 LAB — CBC WITH DIFFERENTIAL/PLATELET
Abs Immature Granulocytes: 0.04 10*3/uL (ref 0.00–0.07)
BASOS PCT: 0 %
Basophils Absolute: 0 10*3/uL (ref 0.0–0.1)
EOS ABS: 0.2 10*3/uL (ref 0.0–0.5)
EOS PCT: 2 %
HEMATOCRIT: 43.3 % (ref 36.0–46.0)
HEMOGLOBIN: 14.4 g/dL (ref 12.0–15.0)
IMMATURE GRANULOCYTES: 0 %
LYMPHS ABS: 5.7 10*3/uL — AB (ref 0.7–4.0)
Lymphocytes Relative: 50 %
MCH: 29.6 pg (ref 26.0–34.0)
MCHC: 33.3 g/dL (ref 30.0–36.0)
MCV: 88.9 fL (ref 80.0–100.0)
MONO ABS: 0.7 10*3/uL (ref 0.1–1.0)
MONOS PCT: 6 %
Neutro Abs: 4.9 10*3/uL (ref 1.7–7.7)
Neutrophils Relative %: 42 %
Platelets: 246 10*3/uL (ref 150–400)
RBC: 4.87 MIL/uL (ref 3.87–5.11)
RDW: 12.4 % (ref 11.5–15.5)
WBC: 11.6 10*3/uL — ABNORMAL HIGH (ref 4.0–10.5)
nRBC: 0 % (ref 0.0–0.2)

## 2017-12-11 LAB — BASIC METABOLIC PANEL
Anion gap: 9 (ref 5–15)
BUN: 11 mg/dL (ref 6–20)
CHLORIDE: 102 mmol/L (ref 98–111)
CO2: 25 mmol/L (ref 22–32)
CREATININE: 0.89 mg/dL (ref 0.44–1.00)
Calcium: 9.4 mg/dL (ref 8.9–10.3)
GFR calc non Af Amer: >60 mL/min (ref >60)
Glucose, Bld: 374 mg/dL — ABNORMAL HIGH (ref 70–99)
Potassium: 3.9 mmol/L (ref 3.5–5.1)
Sodium: 136 mmol/L (ref 135–145)

## 2017-12-11 LAB — TROPONIN I: Troponin I: <0.03 ng/mL (ref <0.03)

## 2017-12-11 MED ORDER — LORAZEPAM 1 MG PO TABS
1.0000 mg | ORAL_TABLET | Freq: Once | ORAL | Status: AC
  Administered 2017-12-11: 1 mg via ORAL
  Filled 2017-12-11: qty 1

## 2017-12-11 MED ORDER — ALBUTEROL SULFATE HFA 108 (90 BASE) MCG/ACT IN AERS
2.0000 | INHALATION_SPRAY | Freq: Once | RESPIRATORY_TRACT | Status: AC
  Administered 2017-12-11: 2 via RESPIRATORY_TRACT

## 2017-12-11 MED ORDER — ONDANSETRON 4 MG PO TBDP
4.0000 mg | ORAL_TABLET | Freq: Once | ORAL | Status: AC
  Administered 2017-12-11: 4 mg via ORAL
  Filled 2017-12-11: qty 1

## 2017-12-11 MED ORDER — ALBUTEROL SULFATE HFA 108 (90 BASE) MCG/ACT IN AERS
2.0000 | INHALATION_SPRAY | Freq: Once | RESPIRATORY_TRACT | Status: AC
  Administered 2017-12-11: 2 via RESPIRATORY_TRACT
  Filled 2017-12-11: qty 6.7

## 2017-12-11 MED ORDER — DEXAMETHASONE SODIUM PHOSPHATE 4 MG/ML IJ SOLN
10.0000 mg | Freq: Once | INTRAMUSCULAR | Status: AC
  Administered 2017-12-11: 10 mg via INTRAMUSCULAR
  Filled 2017-12-11: qty 3

## 2017-12-11 NOTE — ED Provider Notes (Signed)
[facility] EMERGENCY DEPARTMENT Provider Note   CSN: [id number] Arrival date & time: 12/11/17  0409     History   Chief Complaint Chief Complaint  Patient presents with  . Shortness of Breath    HPI Kimberly Marks is a 47 y.o. female.  HPI  This is a 47 year old female with a history of diabetes, hypertension, sleep apnea who was just discharged from my care who presents with anxiety and shortness of breath.  Patient reports that upon discharge she was walking to her car.  She began to feel nervous and anxious.  She subsequently developed shortness of breath and flushing.  She came back inside and vomited.  She does have a history of some anxiety and takes Xanax at home.  She denies any chest pain, abdominal pain.  She did receive nebulizer treatment and Decadron during her prior ED stay.  No known prior adverse effects to these.  Currently she states she feels very anxious and short of breath.  Past Medical History:  Diagnosis Date  . Arthritis   . Diabetes mellitus without complication ([place])   . Hypertension   . Neuropathy   . Obesity   . Sleep apnea     Patient Active Problem List   Diagnosis Date Noted  . Obesity, diabetes, and hypertension syndrome ([place])   . Hypomagnesemia 02/20/2017  . Abnormal EKG 02/20/2017  . Hypophosphatemia 02/20/2017  . CAP (community acquired pneumonia) 02/19/2017  . Hypertension 02/19/2017  . Diabetes mellitus without complication ([place]) 25/95/6387  . Sleep apnea 02/19/2017  . Neuropathy 02/19/2017  . Prediabetes 01/29/2014  . Obesity 01/29/2014  . BACK PAIN 05/20/2008    Past Surgical History:  Procedure Laterality Date  . ABDOMINAL HYSTERECTOMY    . ABDOMINAL SURGERY    . CARPAL TUNNEL RELEASE Bilateral   . LITHOTRIPSY    . TONSILLECTOMY       OB History    Gravida  1   Para  1   Term  1   Preterm      AB      Living        SAB      TAB      Ectopic      Multiple      Live Births                Home Medications    Prior to Admission medications   Medication Sig Start Date End Date Taking? Authorizing Provider  ALPRAZolam Duanne Moron) 0.5 MG tablet Take 0.5 mg by mouth 3 (three) times daily as needed for anxiety.     [provider]  bisoprolol-hydrochlorothiazide (ZIAC) 2.5-6.25 MG tablet Take 1 tablet by mouth at bedtime. 02/21/17   Barton Dubois, MD  gabapentin (NEURONTIN) 300 MG capsule Take 1 capsule (300 mg total) by mouth 2 (two) times daily. 02/21/17   Barton Dubois, MD  glimepiride (AMARYL) 2 MG tablet Take 1 tablet (2 mg total) by mouth daily with breakfast. 02/21/17   Barton Dubois, MD  HYDROcodone-acetaminophen (NORCO/VICODIN) 5-325 MG tablet Take 1 tablet by mouth every 6 (six) hours as needed for moderate pain.    [provider]  Ipratropium-Albuterol (COMBIVENT RESPIMAT) 20-100 MCG/ACT AERS respimat Inhale 1 puff into the lungs every 6 (six) hours as needed for wheezing or shortness of breath. 02/21/17   Barton Dubois, MD  predniSONE (DELTASONE) 20 MG tablet Take 2 tablets by mouth daily times 3 days; then 1 tablet by mouth daily times 3 days;  then half tablet by mouth daily times 3 days and stop prednisone. 02/21/17   Barton Dubois, MD  sertraline (ZOLOFT) 100 MG tablet Take 1 tablet (100 mg total) by mouth at bedtime. 02/21/17   Barton Dubois, MD    Family History Family History  Problem Relation Age of Onset  . Stroke Mother   . Hypertension Mother   . COPD Mother   . Hyperlipidemia Father   . Diabetes Maternal Grandmother   . Heart disease Maternal Grandmother   . Lung cancer Paternal Grandmother     Social History Social History   Tobacco Use  . Smoking status: Former Smoker    Packs/day: 1.00  . Smokeless tobacco: Former Systems developer    Quit date: 11/24/2012  Substance Use Topics  . Alcohol use: Yes    Alcohol/week: 0.0 standard drinks    Comment: occasionally  . Drug use: No     Allergies   Patient has no known  allergies.   Review of Systems Review of Systems  Constitutional: Negative for fever.  Respiratory: Positive for shortness of breath.   Cardiovascular: Negative for chest pain.  Gastrointestinal: Positive for nausea and vomiting. Negative for abdominal pain.  Genitourinary: Negative for dysuria.  Psychiatric/Behavioral: The patient is nervous/anxious.   All other systems reviewed and are negative.    Physical Exam Updated Vital Signs BP 104/70   Pulse 80   Temp 97.9 F (36.6 C) (Oral)   Resp 20   Ht 1.727 m (5\' 8" )   Wt 113.4 kg   SpO2 93%   BMI 38.01 kg/m   Physical Exam  Constitutional: She is oriented to person, place, and time. She appears well-developed and well-nourished.  Morbidly obese, anxious appearing, hyperventilating  HENT:  Head: Normocephalic and atraumatic.  Neck: Neck supple.  Cardiovascular: Normal rate, regular rhythm and normal heart sounds.  Pulmonary/Chest: Effort normal. No respiratory distress. She has no wheezes.  Increased respiratory rate, good air movement, no wheezing noted  Abdominal: Soft. Bowel sounds are normal.  Neurological: She is alert and oriented to person, place, and time.  Skin: Skin is warm and dry.  Psychiatric: Her mood appears anxious.  Nursing note and vitals reviewed.    ED Treatments / Results  Labs (all labs ordered are listed, but only abnormal results are displayed) Labs Reviewed - No data to display  EKG None  Radiology Dg Chest 2 View  Result Date: 12/11/2017 CLINICAL DATA:  47 year old female with shortness of breath EXAM: CHEST - 2 VIEW COMPARISON:  Chest radiograph dated 02/19/2017 FINDINGS: There is no focal consolidation, pleural effusion, or pneumothorax. Mild chronic interstitial coarsening. The cardiac silhouette is within normal limits. No acute osseous pathology. IMPRESSION: No active cardiopulmonary disease. Electronically Signed   By: Anner Crete M.D.   On: 12/11/2017 01:50     Procedures Procedures (including critical care time)  Medications Ordered in ED Medications  ondansetron (ZOFRAN-ODT) disintegrating tablet 4 mg (4 mg Oral Given 12/11/17 0431)  LORazepam (ATIVAN) tablet 1 mg (1 mg Oral Given 12/11/17 0431)     Initial Impression / Assessment and Plan / ED Course  I have reviewed the triage vital signs and the nursing notes.  Pertinent labs & imaging results that were available during my care of the patient were reviewed by me and considered in my medical decision making (see chart for details).  Clinical Course as of Dec 11 700  Tue Dec 11, 2017  0541 Patient resting comfortably.  O2 sats 95%.  No  respiratory distress noted.   [CH]    Clinical Course User Index [CH] Mikeisha Lemonds, Barbette Hair, MD    Patient presents after being discharged with increasing anxiety and return of shortness of breath.  On my evaluation she is very anxious.  She is hyperventilating.  Her O2 sats are very reassuring.  Her pulmonary exam at this time is benign.  Her other vital signs are reassuring.  Patient also reports that she feels flushed and anxious.  She has a history of such and takes Xanax.  Patient was given Ativan and Zofran.  Will not pursue further work-up regarding shortness of breath at this time given recent reassuring work-up.  Suspect her symptoms are related to anxiety.  Patient was monitored in the ED for over 2 hours.  On multiple occasions, noted to be resting comfortably with normal O2 sats and respiratory rate.  On recheck, patient is able to ambulate and maintain O2 sats.  No recurrence of symptoms.  Recommend very close follow-up with her primary physician.  Patient may have had anxiety provoked by multiple DuoNeb's during last visit.  She was given a dose of steroids; however, would not anticipate this would cause any adverse effects immediately.  This was discussed with the patient.  After history, exam, and medical workup I feel the patient has been  appropriately medically screened and is safe for discharge home. Pertinent diagnoses were discussed with the patient. Patient was given return precautions.   Final Clinical Impressions(s) / ED Diagnoses   Final diagnoses:  Anxiety attack    ED Discharge Orders    None       Mckensie Scotti, Barbette Hair, MD 12/11/17 [phone]

## 2017-12-11 NOTE — Discharge Instructions (Addendum)
You were seen today for shortness of breath.  You are given a dose of Decadron and an inhaler.  Use as needed.  Part of your shortness of breath may be related to your sleep apnea.  You should use your CPAP machine.  Follow-up with your primary physician regarding elevated blood sugars.  If you have any new or worsening symptoms you should be reevaluated.

## 2017-12-11 NOTE — ED Notes (Signed)
ED Provider at bedside. 

## 2017-12-11 NOTE — ED Triage Notes (Signed)
Pt c/o sob and tightness in her chest that woke her up this evening; pt states the sob and chest pain has been going on x one month

## 2017-12-11 NOTE — ED Notes (Signed)
Pt returned from X Ray.

## 2017-12-11 NOTE — ED Notes (Signed)
Ambulated Pt on Pulse Ox around [place] X [place]. Pt maintained O2 Sats >94% with Pulse-98 BPM. Pt denies N/V or SOB. MD Notified.

## 2017-12-11 NOTE — ED Notes (Signed)
Patient transported to X-ray 

## 2017-12-11 NOTE — Discharge Instructions (Addendum)
You were seen again for shortness of breath.  This time this was associated with significant anxiety.  Your vital signs remained very reassuring while you were evaluated.  Follow-up closely with your primary physician.

## 2017-12-11 NOTE — ED Notes (Signed)
Ambulated Pt around nurse station with Pulse Ox. Pts O2 Sats remained above 96% with Pulse-98 BPM

## 2017-12-11 NOTE — ED Triage Notes (Signed)
Pt just discharged from ED and returns stating between time of signing out and making her way her vehicle, she began to feel funny and nauseated and feels her nerves are acting up and is stating she feels SOB. Upon arrival to ED waiting area to reassess Pt, RN observed Pt had an episode of emesis and heavy breathing. Pt stated she was about to take a Xanax but started feeling sick and never got to take her medication.

## 2017-12-11 NOTE — ED Provider Notes (Signed)
[facility] EMERGENCY DEPARTMENT Provider Note   CSN: [id number] Arrival date & time: 12/11/17  0057     History   Chief Complaint Chief Complaint  Patient presents with  . Shortness of Breath    HPI Kimberly Marks is a 47 y.o. female.  HPI  This 47 year old female with a history of diabetes, hypertension, sleep apnea who presents with shortness of breath.  Patient reports over the last month she has had increasing episodes of shortness of breath.  She states that they are self-limited and go away on their own.  Tonight however, she just could not catch her breath.  She denies any cough or fever.  She is a former smoker but no formal diagnosis of COPD.  She is does state that she has had to use Lasix in the past.  Denies any lower extremity swelling orthopnea.  Nothing seems to make her shortness of breath better or worse.  She reports some chest tightness during these episodes.  Denies any upper respiratory symptoms including cough, congestion, nausea, vomiting, diarrhea.  Past Medical History:  Diagnosis Date  . Arthritis   . Diabetes mellitus without complication ([place])   . Hypertension   . Neuropathy   . Obesity   . Sleep apnea     Patient Active Problem List   Diagnosis Date Noted  . Obesity, diabetes, and hypertension syndrome ([place])   . Hypomagnesemia 02/20/2017  . Abnormal EKG 02/20/2017  . Hypophosphatemia 02/20/2017  . CAP (community acquired pneumonia) 02/19/2017  . Hypertension 02/19/2017  . Diabetes mellitus without complication ([place]) 15/72/6203  . Sleep apnea 02/19/2017  . Neuropathy 02/19/2017  . Prediabetes 01/29/2014  . Obesity 01/29/2014  . BACK PAIN 05/20/2008    Past Surgical History:  Procedure Laterality Date  . ABDOMINAL HYSTERECTOMY    . ABDOMINAL SURGERY    . CARPAL TUNNEL RELEASE Bilateral   . LITHOTRIPSY    . TONSILLECTOMY       OB History    Gravida  1   Para  1   Term  1   Preterm      AB      Living        SAB      TAB      Ectopic      Multiple      Live Births               Home Medications    Prior to Admission medications   Medication Sig Start Date End Date Taking? Authorizing Provider  ALPRAZolam Duanne Moron) 0.5 MG tablet Take 0.5 mg by mouth 3 (three) times daily as needed for anxiety.     [provider]  bisoprolol-hydrochlorothiazide (ZIAC) 2.5-6.25 MG tablet Take 1 tablet by mouth at bedtime. 02/21/17   Barton Dubois, MD  gabapentin (NEURONTIN) 300 MG capsule Take 1 capsule (300 mg total) by mouth 2 (two) times daily. 02/21/17   Barton Dubois, MD  glimepiride (AMARYL) 2 MG tablet Take 1 tablet (2 mg total) by mouth daily with breakfast. 02/21/17   Barton Dubois, MD  HYDROcodone-acetaminophen (NORCO/VICODIN) 5-325 MG tablet Take 1 tablet by mouth every 6 (six) hours as needed for moderate pain.    [provider]  Ipratropium-Albuterol (COMBIVENT RESPIMAT) 20-100 MCG/ACT AERS respimat Inhale 1 puff into the lungs every 6 (six) hours as needed for wheezing or shortness of breath. 02/21/17   Barton Dubois, MD  predniSONE (DELTASONE) 20 MG tablet Take 2 tablets by mouth daily times 3  days; then 1 tablet by mouth daily times 3 days; then half tablet by mouth daily times 3 days and stop prednisone. 02/21/17   Barton Dubois, MD  sertraline (ZOLOFT) 100 MG tablet Take 1 tablet (100 mg total) by mouth at bedtime. 02/21/17   Barton Dubois, MD    Family History Family History  Problem Relation Age of Onset  . Stroke Mother   . Hypertension Mother   . COPD Mother   . Hyperlipidemia Father   . Diabetes Maternal Grandmother   . Heart disease Maternal Grandmother   . Lung cancer Paternal Grandmother     Social History Social History   Tobacco Use  . Smoking status: Former Smoker    Packs/day: 1.00  . Smokeless tobacco: Former Systems developer    Quit date: 11/24/2012  Substance Use Topics  . Alcohol use: Yes    Alcohol/week: 0.0 standard drinks    Comment: occasionally  . Drug  use: No     Allergies   Patient has no known allergies.   Review of Systems Review of Systems  Constitutional: Negative for fever.  Respiratory: Positive for chest tightness and shortness of breath. Negative for cough.   Cardiovascular: Negative for leg swelling.  Gastrointestinal: Negative for abdominal pain, nausea and vomiting.  Genitourinary: Negative for dysuria.  All other systems reviewed and are negative.    Physical Exam Updated Vital Signs BP 130/76   Pulse 82   Temp 98.2 F (36.8 C) (Oral)   Resp 15   Ht 1.727 m (5\' 8" )   Wt 113.4 kg   SpO2 97%   BMI 38.01 kg/m   Physical Exam  Constitutional: She is oriented to person, place, and time. She appears well-developed and well-nourished.  Morbidly obese  HENT:  Head: Normocephalic and atraumatic.  Eyes: Pupils are equal, round, and reactive to light.  Neck: Neck supple.  Cardiovascular: Normal rate, regular rhythm and normal heart sounds.  Pulmonary/Chest: Effort normal. No respiratory distress. She has no wheezes.  Good air movement, occasional wheeze  Abdominal: Soft. Bowel sounds are normal.  Musculoskeletal:       Right lower leg: She exhibits edema.       Left lower leg: She exhibits edema.  Trace bilateral lower extremity edema  Neurological: She is alert and oriented to person, place, and time.  Skin: Skin is warm and dry.  Psychiatric: She has a normal mood and affect.  Nursing note and vitals reviewed.    ED Treatments / Results  Labs (all labs ordered are listed, but only abnormal results are displayed) Labs Reviewed  CBC WITH DIFFERENTIAL/PLATELET - Abnormal; Notable for the following components:      Result Value   WBC 11.6 (*)    Lymphs Abs 5.7 (*)    All other components within normal limits  BASIC METABOLIC PANEL - Abnormal; Notable for the following components:   Glucose, Bld 374 (*)    All other components within normal limits  TROPONIN I    EKG EKG  Interpretation  Date/Time:  Tuesday December 11 2017 01:11:38 EST Ventricular Rate:  83 PR Interval:    QRS Duration: 103 QT Interval:  386 QTC Calculation: 454 R Axis:   94 Text Interpretation:  Sinus rhythm Probable left atrial enlargement Borderline right axis deviation Low voltage, precordial leads Borderline T abnormalities, anterior leads Baseline wander in lead(s) III aVF V2 Confirmed by Thayer Jew [phone]) on 12/11/2017 1:22:13 AM   Radiology Dg Chest 2 View  Result Date: 12/11/2017  CLINICAL DATA:  47 year old female with shortness of breath EXAM: CHEST - 2 VIEW COMPARISON:  Chest radiograph dated 02/19/2017 FINDINGS: There is no focal consolidation, pleural effusion, or pneumothorax. Mild chronic interstitial coarsening. The cardiac silhouette is within normal limits. No acute osseous pathology. IMPRESSION: No active cardiopulmonary disease. Electronically Signed   By: Anner Crete M.D.   On: 12/11/2017 01:50    Procedures Procedures (including critical care time)  Medications Ordered in ED Medications  albuterol (PROVENTIL HFA;VENTOLIN HFA) 108 (90 Base) MCG/ACT inhaler 2 puff (2 puffs Inhalation Given 12/11/17 0137)  dexamethasone (DECADRON) injection 10 mg (10 mg Intramuscular Given 12/11/17 0247)  albuterol (PROVENTIL HFA;VENTOLIN HFA) 108 (90 Base) MCG/ACT inhaler 2 puff (2 puffs Inhalation Given 12/11/17 0248)     Initial Impression / Assessment and Plan / ED Course  I have reviewed the triage vital signs and the nursing notes.  Pertinent labs & imaging results that were available during my care of the patient were reviewed by me and considered in my medical decision making (see chart for details).     Presents with shortness of breath and chest tightness.  She is overall nontoxic-appearing and vital signs are largely reassuring.  Physical exam is largely benign.  She has only an occasional wheeze.  She is in no respiratory distress.  EKG shows no signs of  ischemia or arrhythmia.  Troponin is negative.  Very atypical for ACS.  Suspect pulmonary etiology.  She has no signs or symptoms of volume overload on chest x-ray or peripherally.  She does have a smoking history and a very occasional wheeze.  She was given albuterol.  She did report some improvement with albuterol.  I suspect given that some of her symptoms are worse at night, she may have a component of worsening sleep apnea.  She does not tolerate her CPAP machine.  I have encouraged her to try to use it to see if this helps.  Additionally, she was given Decadron and will be sent home with an inhaler to see if this helps.  Follow-up with primary physician.  Of note, she was noted to be hyperglycemic.  No evidence of anion gap or DKA.  After history, exam, and medical workup I feel the patient has been appropriately medically screened and is safe for discharge home. Pertinent diagnoses were discussed with the patient. Patient was given return precautions.   Final Clinical Impressions(s) / ED Diagnoses   Final diagnoses:  SOB (shortness of breath)  Hyperglycemia    ED Discharge Orders    None       Horton, Barbette Hair, MD 12/11/17 [phone]

## 2018-05-27 IMAGING — DX DG CHEST 2V
2 series · 2 of 2 positions shown · non-contrast
Comparison: None.

CLINICAL DATA: Dyspnea since yesterday.

EXAM:
CHEST  2 VIEW

[chest pa]
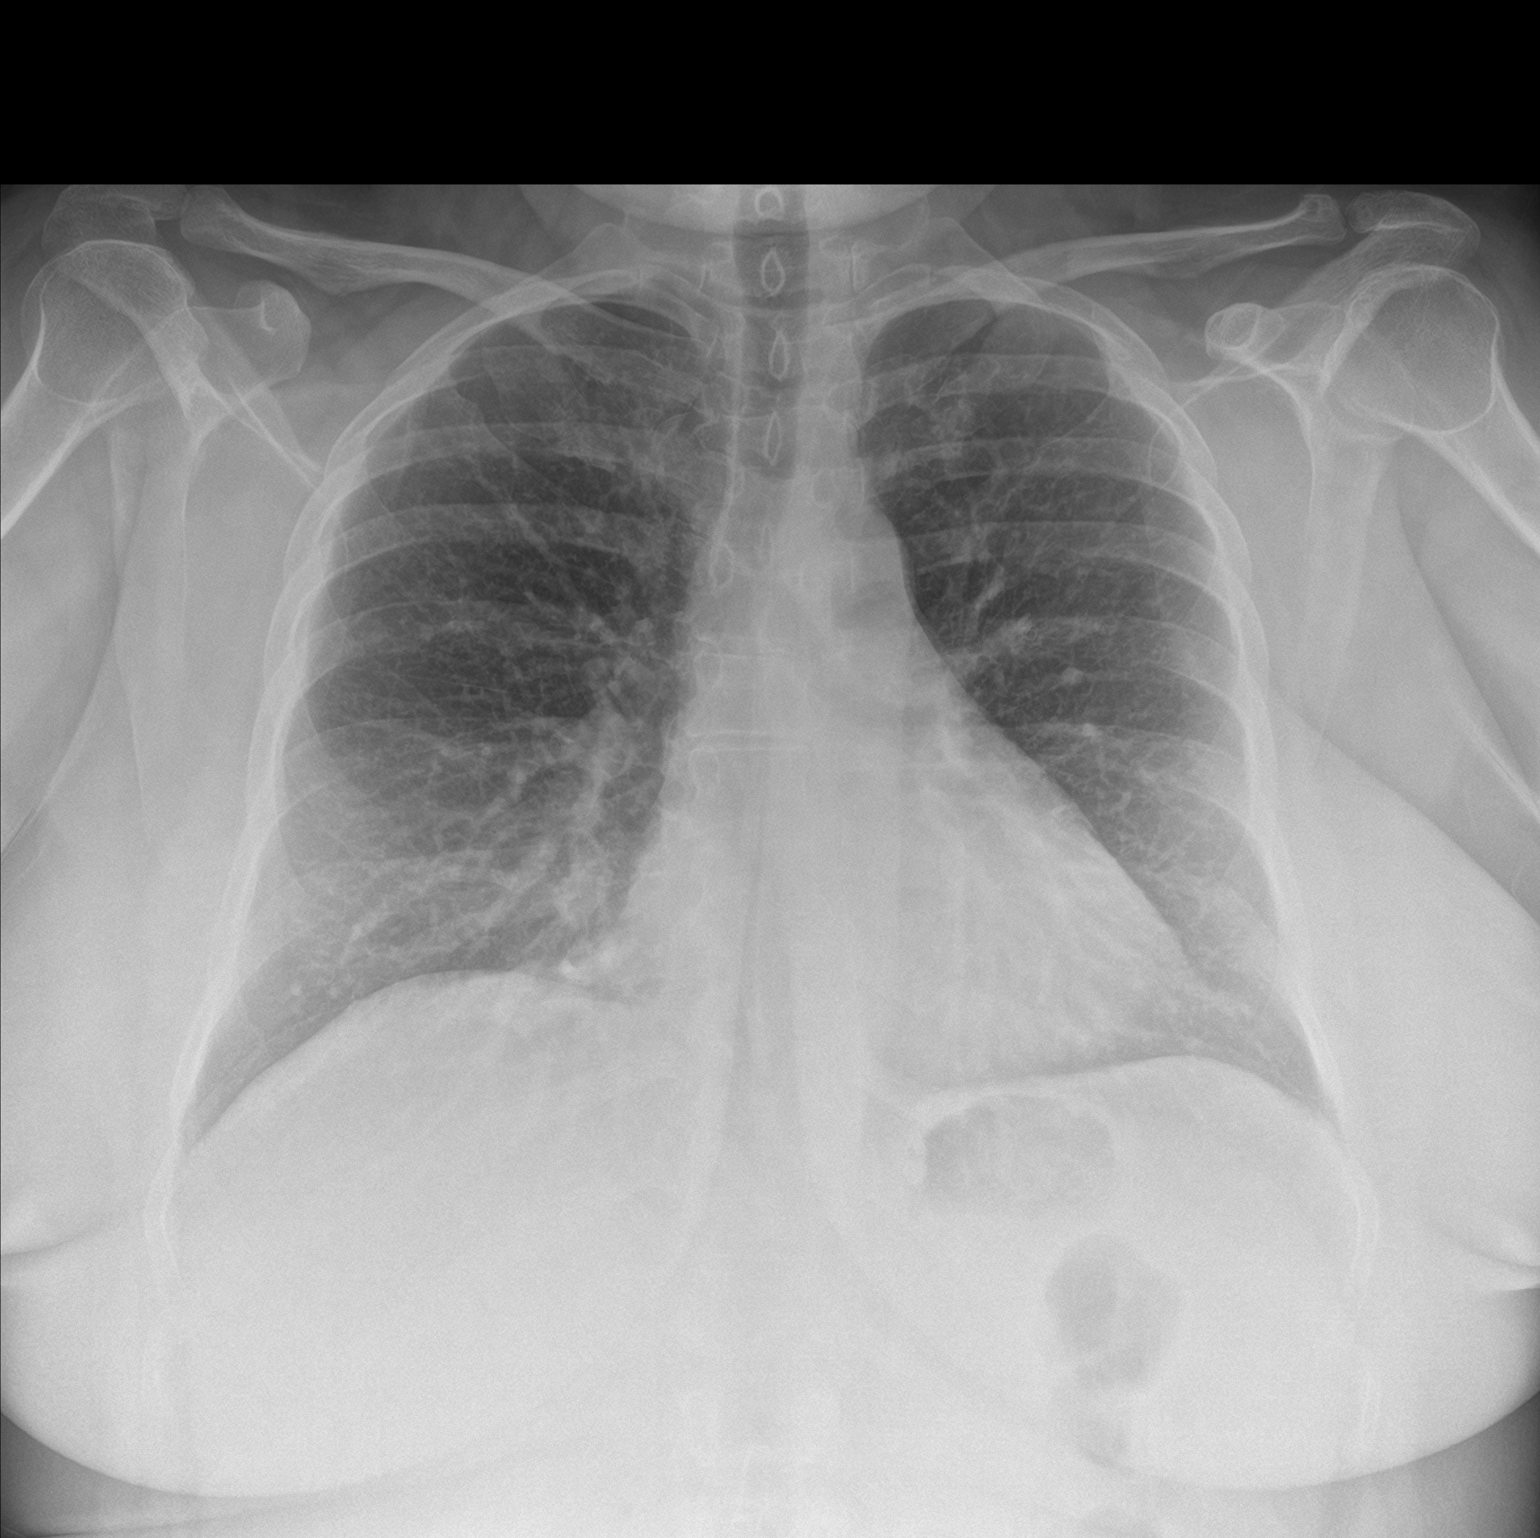

[chest lat]
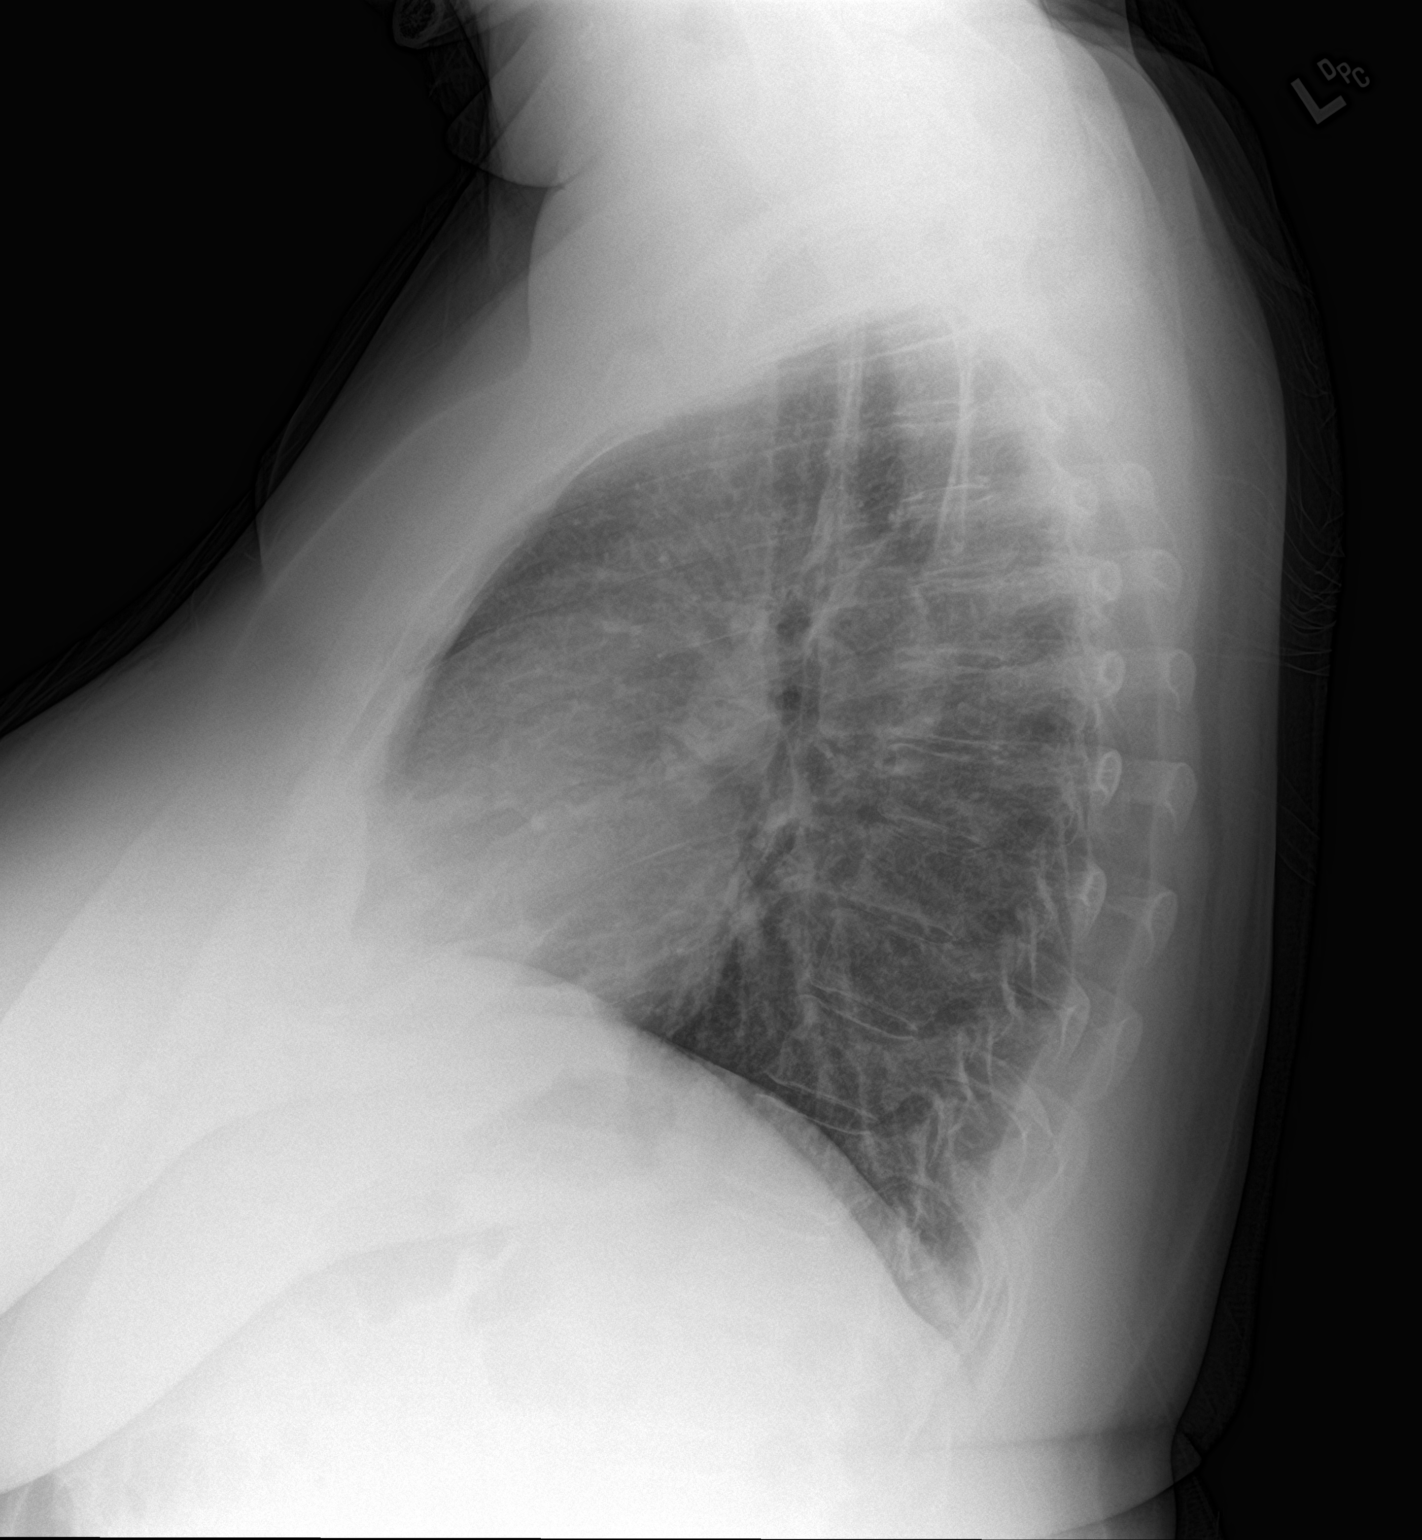

[2 of 2 positions shown; findings below may reference images not displayed]

FINDINGS: The lungs are clear. The pulmonary vasculature is normal. Heart size
is normal. Hilar and mediastinal contours are unremarkable. There is
no pleural effusion.
IMPRESSION: No active cardiopulmonary disease.

## 2018-06-01 ENCOUNTER — Encounter (HOSPITAL_COMMUNITY): Payer: Self-pay | Admitting: Emergency Medicine

## 2018-06-01 ENCOUNTER — Emergency Department (HOSPITAL_COMMUNITY): Payer: Self-pay

## 2018-06-01 ENCOUNTER — Other Ambulatory Visit: Payer: Self-pay

## 2018-06-01 ENCOUNTER — Emergency Department (HOSPITAL_COMMUNITY)
Admission: EM | Admit: 2018-06-01 | Discharge: 2018-06-01 | Disposition: A | Discharge status: Home / Self Care | Payer: Self-pay | Attending: Emergency Medicine | Admitting: Emergency Medicine

## 2018-06-01 DIAGNOSIS — Z7984 Long term (current) use of oral hypoglycemic drugs: Secondary | ICD-10-CM | POA: Insufficient documentation

## 2018-06-01 DIAGNOSIS — I10 Essential (primary) hypertension: Secondary | ICD-10-CM | POA: Insufficient documentation

## 2018-06-01 DIAGNOSIS — E119 Type 2 diabetes mellitus without complications: Secondary | ICD-10-CM | POA: Insufficient documentation

## 2018-06-01 DIAGNOSIS — R109 Unspecified abdominal pain: Secondary | ICD-10-CM

## 2018-06-01 DIAGNOSIS — Z87891 Personal history of nicotine dependence: Secondary | ICD-10-CM | POA: Insufficient documentation

## 2018-06-01 DIAGNOSIS — D171 Benign lipomatous neoplasm of skin and subcutaneous tissue of trunk: Secondary | ICD-10-CM | POA: Insufficient documentation

## 2018-06-01 DIAGNOSIS — Z79899 Other long term (current) drug therapy: Secondary | ICD-10-CM | POA: Insufficient documentation

## 2018-06-01 LAB — LIPASE, BLOOD: Lipase: 45 U/L (ref 11–51)

## 2018-06-01 LAB — CBC WITH DIFFERENTIAL/PLATELET
Abs Immature Granulocytes: 0.03 10*3/uL (ref 0.00–0.07)
Basophils Absolute: 0 10*3/uL (ref 0.0–0.1)
Basophils Relative: 0 %
Eosinophils Absolute: 0.1 10*3/uL (ref 0.0–0.5)
Eosinophils Relative: 1 %
HCT: 43.1 % (ref 36.0–46.0)
Hemoglobin: 14.5 g/dL (ref 12.0–15.0)
Immature Granulocytes: 0 %
Lymphocytes Relative: 40 %
Lymphs Abs: 5.1 10*3/uL — ABNORMAL HIGH (ref 0.7–4.0)
MCH: 29.5 pg (ref 26.0–34.0)
MCHC: 33.6 g/dL (ref 30.0–36.0)
MCV: 87.8 fL (ref 80.0–100.0)
Monocytes Absolute: 0.7 10*3/uL (ref 0.1–1.0)
Monocytes Relative: 6 %
Neutro Abs: 6.6 10*3/uL (ref 1.7–7.7)
Neutrophils Relative %: 53 %
Platelets: 268 10*3/uL (ref 150–400)
RBC: 4.91 MIL/uL (ref 3.87–5.11)
RDW: 13 % (ref 11.5–15.5)
WBC: 12.6 10*3/uL — ABNORMAL HIGH (ref 4.0–10.5)
nRBC: 0 % (ref 0.0–0.2)

## 2018-06-01 LAB — COMPREHENSIVE METABOLIC PANEL
ALT: 38 U/L (ref 0–44)
AST: 30 U/L (ref 15–41)
Albumin: 4.2 g/dL (ref 3.5–5.0)
Alkaline Phosphatase: 126 U/L (ref 38–126)
Anion gap: 11 (ref 5–15)
BUN: 12 mg/dL (ref 6–20)
CO2: 23 mmol/L (ref 22–32)
Calcium: 9.2 mg/dL (ref 8.9–10.3)
Chloride: 104 mmol/L (ref 98–111)
Creatinine, Ser: 0.68 mg/dL (ref 0.44–1.00)
GFR calc Af Amer: >60 mL/min (ref >60)
GFR calc non Af Amer: >60 mL/min (ref >60)
Glucose, Bld: 183 mg/dL — ABNORMAL HIGH (ref 70–99)
Potassium: 3.5 mmol/L (ref 3.5–5.1)
Sodium: 138 mmol/L (ref 135–145)
Total Bilirubin: 0.7 mg/dL (ref 0.3–1.2)
Total Protein: 7.6 g/dL (ref 6.5–8.1)

## 2018-06-01 LAB — URINALYSIS, ROUTINE W REFLEX MICROSCOPIC
Bacteria, UA: NONE SEEN
Bilirubin Urine: NEGATIVE
Glucose, UA: >=500 mg/dL — AB
Hgb urine dipstick: NEGATIVE
Ketones, ur: NEGATIVE mg/dL
Leukocytes,Ua: NEGATIVE
Nitrite: NEGATIVE
Protein, ur: NEGATIVE mg/dL
Specific Gravity, Urine: 1.025 (ref 1.005–1.030)
pH: 5 (ref 5.0–8.0)

## 2018-06-01 MED ORDER — HYDROCODONE-ACETAMINOPHEN 5-325 MG PO TABS
1.0000 | ORAL_TABLET | Freq: Once | ORAL | Status: AC
  Administered 2018-06-01: 1 via ORAL
  Filled 2018-06-01: qty 1

## 2018-06-01 MED ORDER — IOHEXOL 300 MG/ML  SOLN
100.0000 mL | Freq: Once | INTRAMUSCULAR | Status: AC | PRN
  Administered 2018-06-01: 100 mL via INTRAVENOUS

## 2018-06-01 NOTE — ED Notes (Signed)
Pt taken to CT.

## 2018-06-01 NOTE — ED Triage Notes (Signed)
Patient reports "knot" in her L side that started 2 weeks ago, became painful today. Denies urinary symptoms. No v/d/n. No fever.

## 2018-06-01 NOTE — Discharge Instructions (Addendum)
Take an antiinflammatory such as Aleve or Motrin daily as directed for 7 days. Follow up with your doctor for recheck. Return to the ER for new or worsening symptoms.

## 2018-06-01 NOTE — ED Notes (Signed)
Pt denies n/v/d 

## 2018-06-01 NOTE — ED Provider Notes (Signed)
[facility] EMERGENCY DEPARTMENT Provider Note   CSN: [id number] Arrival date & time: 06/01/18  1715    History   Chief Complaint Chief Complaint  Patient presents with  . Flank Pain    HPI Kimberly Marks is a 48 y.o. female.     48yo female with history of DM, obesity, HTN, kidney stones, presents with complaint of a painful knot on her left side. Patient states she first noticed the knot a few weeks ago, developed pain in the area 2 hours prior to arrival today. Pain is intermittent, described as bubbly, worse with coughing, nothing makes pain better. Denies nausea, vomiting, changes in bowel or bladder habits. No other complaints or concerns.     Past Medical History:  Diagnosis Date  . Arthritis   . Diabetes mellitus without complication ([place])   . Hypertension   . Neuropathy   . Obesity   . Sleep apnea     Patient Active Problem List   Diagnosis Date Noted  . Obesity, diabetes, and hypertension syndrome ([place])   . Hypomagnesemia 02/20/2017  . Abnormal EKG 02/20/2017  . Hypophosphatemia 02/20/2017  . CAP (community acquired pneumonia) 02/19/2017  . Hypertension 02/19/2017  . Diabetes mellitus without complication ([place]) 53/66/4403  . Sleep apnea 02/19/2017  . Neuropathy 02/19/2017  . Prediabetes 01/29/2014  . Obesity 01/29/2014  . BACK PAIN 05/20/2008    Past Surgical History:  Procedure Laterality Date  . ABDOMINAL HYSTERECTOMY    . ABDOMINAL SURGERY    . CARPAL TUNNEL RELEASE Bilateral   . LITHOTRIPSY    . TONSILLECTOMY       OB History    Gravida  1   Para  1   Term  1   Preterm      AB      Living  1     SAB      TAB      Ectopic      Multiple      Live Births               Home Medications    Prior to Admission medications   Medication Sig Start Date End Date Taking? Authorizing Provider  ALPRAZolam Duanne Moron) 0.5 MG tablet Take 0.5 mg by mouth 3 (three) times daily as needed for anxiety.     [provider]   bisoprolol-hydrochlorothiazide (ZIAC) 2.5-6.25 MG tablet Take 1 tablet by mouth at bedtime. 02/21/17   Barton Dubois, MD  gabapentin (NEURONTIN) 300 MG capsule Take 1 capsule (300 mg total) by mouth 2 (two) times daily. 02/21/17   Barton Dubois, MD  glimepiride (AMARYL) 2 MG tablet Take 1 tablet (2 mg total) by mouth daily with breakfast. 02/21/17   Barton Dubois, MD  HYDROcodone-acetaminophen (NORCO/VICODIN) 5-325 MG tablet Take 1 tablet by mouth every 6 (six) hours as needed for moderate pain.    [provider]  Ipratropium-Albuterol (COMBIVENT RESPIMAT) 20-100 MCG/ACT AERS respimat Inhale 1 puff into the lungs every 6 (six) hours as needed for wheezing or shortness of breath. 02/21/17   Barton Dubois, MD  predniSONE (DELTASONE) 20 MG tablet Take 2 tablets by mouth daily times 3 days; then 1 tablet by mouth daily times 3 days; then half tablet by mouth daily times 3 days and stop prednisone. 02/21/17   Barton Dubois, MD  sertraline (ZOLOFT) 100 MG tablet Take 1 tablet (100 mg total) by mouth at bedtime. 02/21/17   Barton Dubois, MD    Family History Family History  Problem Relation Age of Onset  . Stroke Mother   . Hypertension Mother   . COPD Mother   . Hyperlipidemia Father   . Diabetes Maternal Grandmother   . Heart disease Maternal Grandmother   . Lung cancer Paternal Grandmother     Social History Social History   Tobacco Use  . Smoking status: Former Smoker    Packs/day: 1.00  . Smokeless tobacco: Former Systems developer    Quit date: 11/24/2012  Substance Use Topics  . Alcohol use: Yes    Alcohol/week: 0.0 standard drinks    Comment: occasionally  . Drug use: No     Allergies   Patient has no known allergies.   Review of Systems Review of Systems  Constitutional: Negative for chills, diaphoresis and fever.  Respiratory: Negative for shortness of breath.   Cardiovascular: Negative for chest pain.  Gastrointestinal: Positive for abdominal pain. Negative for  constipation, diarrhea, nausea and vomiting.  Genitourinary: Negative for dysuria, frequency, hematuria and urgency.  Musculoskeletal: Negative for arthralgias, back pain and myalgias.  Skin: Negative for rash and wound.  Allergic/Immunologic: Positive for immunocompromised state.     Physical Exam Updated Vital Signs BP 129/81 (BP Location: Left Arm)   Pulse 72   Temp (!) 97.5 F (36.4 C) (Oral)   Resp 16   Ht 5\' 9"  (1.753 m)   Wt 113.4 kg   SpO2 97%   BMI 36.92 kg/m   Physical Exam Vitals signs and nursing note reviewed.  Constitutional:      General: She is not in acute distress.    Appearance: She is well-developed. She is not diaphoretic.  HENT:     Head: Normocephalic and atraumatic.  Cardiovascular:     Rate and Rhythm: Normal rate and regular rhythm.     Pulses: Normal pulses.  Pulmonary:     Effort: Pulmonary effort is normal.     Breath sounds: Normal breath sounds.  Abdominal:     Tenderness: There is abdominal tenderness.    Skin:    General: Skin is warm and dry.     Findings: No erythema or rash.  Neurological:     Mental Status: She is alert and oriented to person, place, and time.  Psychiatric:        Behavior: Behavior normal.      ED Treatments / Results  Labs (all labs ordered are listed, but only abnormal results are displayed) Labs Reviewed  CBC WITH DIFFERENTIAL/PLATELET - Abnormal; Notable for the following components:      Result Value   WBC 12.6 (*)    Lymphs Abs 5.1 (*)    All other components within normal limits  COMPREHENSIVE METABOLIC PANEL - Abnormal; Notable for the following components:   Glucose, Bld 183 (*)    All other components within normal limits  URINALYSIS, ROUTINE W REFLEX MICROSCOPIC - Abnormal; Notable for the following components:   Glucose, UA >=500 (*)    All other components within normal limits  LIPASE, BLOOD    EKG None  Radiology Ct Abdomen Pelvis W Contrast  Result Date: 06/01/2018 CLINICAL  DATA:  Generalized abdominal pain EXAM: CT ABDOMEN AND PELVIS WITH CONTRAST TECHNIQUE: Multidetector CT imaging of the abdomen and pelvis was performed using the standard protocol following bolus administration of intravenous contrast. CONTRAST:  156mL OMNIPAQUE IOHEXOL 300 MG/ML  SOLN COMPARISON:  12/16/2014, CT 10/15/2014 FINDINGS: Lower chest: Lung bases show no acute consolidation or effusion. Heart size within normal limits Hepatobiliary: No focal liver abnormality  is seen. No gallstones, gallbladder wall thickening, or biliary dilatation. Pancreas: Unremarkable. No pancreatic ductal dilatation or surrounding inflammatory changes. Spleen: Normal in size without focal abnormality. Adrenals/Urinary Tract: Adrenal glands are normal. No hydronephrosis. Cyst in the midpole of the left kidney. The bladder is normal Stomach/Bowel: Stomach is within normal limits. Appendix appears normal. No evidence of bowel wall thickening, distention, or inflammatory changes. Vascular/Lymphatic: Mild aortic atherosclerosis without aneurysm. No significantly enlarged lymph nodes Reproductive: Status post hysterectomy. No adnexal masses. Other: Negative for free air or free fluid. Small fat in the umbilical region Musculoskeletal: Degenerative changes without acute or suspicious abnormality. IMPRESSION: 1. No CT evidence for acute intra-abdominal or pelvic abnormality. Electronically Signed   By: Donavan Foil M.D.   On: 06/01/2018 19:06    Procedures Procedures (including critical care time)  Medications Ordered in ED Medications  HYDROcodone-acetaminophen (NORCO/VICODIN) 5-325 MG per tablet 1 tablet (1 tablet Oral Given 06/01/18 1756)  iohexol (OMNIPAQUE) 300 MG/ML solution 100 mL (100 mLs Intravenous Contrast Given 06/01/18 1846)     Initial Impression / Assessment and Plan / ED Course  I have reviewed the triage vital signs and the nursing notes.  Pertinent labs & imaging results that were available during my care of  the patient were reviewed by me and considered in my medical decision making (see chart for details).  Clinical Course as of Jun 01 1930  Sat Jun 01, 2018  1930 47yo female with painful lump in her left upper quadrant area. On exam, palpable mobile tender mass to left flank area without overlying erythema. Do not suspect abscess. Consider lipoma vs hernia. CT without acute findings, labs reassuring, WBC mildly elevated at 12.6 without obvious source for infection. Suspect inflamed lipoma. Recommend NSAID and recheck with PCP, return to ER for new or worsening symptoms.    [LM]    Clinical Course User Index [LM] Tacy Learn, PA-C      Final Clinical Impressions(s) / ED Diagnoses   Final diagnoses:  Flank pain  Lipoma of torso    ED Discharge Orders    None       Roque Lias 06/01/18 1932    Daleen Bo, MD 06/03/18 [phone]

## 2018-09-15 ENCOUNTER — Other Ambulatory Visit: Payer: Self-pay

## 2018-09-15 ENCOUNTER — Encounter (HOSPITAL_COMMUNITY): Payer: Self-pay | Admitting: Emergency Medicine

## 2018-09-15 ENCOUNTER — Emergency Department (HOSPITAL_COMMUNITY)
Admission: EM | Admit: 2018-09-15 | Discharge: 2018-09-16 | Disposition: A | Discharge status: Home / Self Care | Payer: Self-pay | Attending: Emergency Medicine | Admitting: Emergency Medicine

## 2018-09-15 DIAGNOSIS — L03031 Cellulitis of right toe: Secondary | ICD-10-CM | POA: Insufficient documentation

## 2018-09-15 DIAGNOSIS — Z79899 Other long term (current) drug therapy: Secondary | ICD-10-CM | POA: Insufficient documentation

## 2018-09-15 DIAGNOSIS — I10 Essential (primary) hypertension: Secondary | ICD-10-CM | POA: Insufficient documentation

## 2018-09-15 DIAGNOSIS — Z6834 Body mass index (BMI) 34.0-34.9, adult: Secondary | ICD-10-CM | POA: Insufficient documentation

## 2018-09-15 DIAGNOSIS — E669 Obesity, unspecified: Secondary | ICD-10-CM | POA: Insufficient documentation

## 2018-09-15 DIAGNOSIS — Z87891 Personal history of nicotine dependence: Secondary | ICD-10-CM | POA: Insufficient documentation

## 2018-09-15 DIAGNOSIS — E119 Type 2 diabetes mellitus without complications: Secondary | ICD-10-CM | POA: Insufficient documentation

## 2018-09-15 LAB — CBG MONITORING, ED: Glucose-Capillary: 269 mg/dL — ABNORMAL HIGH (ref 70–99)

## 2018-09-15 MED ORDER — CEPHALEXIN 500 MG PO CAPS: 500.0000 mg | ORAL_CAPSULE | Freq: Three times a day (TID) | ORAL | 0 refills | Status: DC

## 2018-09-15 MED ORDER — SULFAMETHOXAZOLE-TRIMETHOPRIM 800-160 MG PO TABS: 1.0000 | ORAL_TABLET | Freq: Two times a day (BID) | ORAL | 0 refills | Status: DC

## 2018-09-15 MED ORDER — SULFAMETHOXAZOLE-TRIMETHOPRIM 800-160 MG PO TABS
1.0000 | ORAL_TABLET | Freq: Once | ORAL | Status: AC
  Administered 2018-09-16: 1 via ORAL
  Filled 2018-09-15: qty 1

## 2018-09-15 MED ORDER — CEPHALEXIN 500 MG PO CAPS
500.0000 mg | ORAL_CAPSULE | Freq: Once | ORAL | Status: AC
  Administered 2018-09-16: 500 mg via ORAL
  Filled 2018-09-15: qty 1

## 2018-09-15 NOTE — ED Triage Notes (Signed)
Pt with c/o toe pain (4th metatarsal) x 3 days. States she is diabetic and is worried it might be infected. $th metatarsal on R foot reddened and swollen and pt states it "pulsates".

## 2018-09-15 NOTE — Discharge Instructions (Addendum)
You can soak your toe in warm epsom salts (do not burn your skin). Take the antibiotics until gone. Monitor your blood sugars closely. Look at the diabetic diet information. Return to the ED if you get a fever, chills or the redness extends up into your foot or you see a red streak running up your foot.

## 2018-09-15 NOTE — ED Provider Notes (Signed)
[facility] EMERGENCY DEPARTMENT Provider Note   CSN: [id number] Arrival date & time: 09/15/18  2315   Time seen 11:42 PM  History   Chief Complaint Chief Complaint  Patient presents with  . Toe Pain    HPI Kimberly Marks is a 47 y.o. female.     HPI patient reports she has had diabetes for about 5 years and she admits is not well controlled.  Just from talking to her it sounds like she does not check her diabetes on a regular basis but she states normally in the morning her blood sugars will be in the 200s.  She denies any known trauma but states she was clipping her toenails and then her right fourth toe got sore and she then took the clippers and was trying to cut the cuticle thinking she may have had an ingrown toenail.  She denies any fevers or chills.  She thinks she has had a tetanus injection in the last 10 years.  PCP Sharilyn Sites, MD   Past Medical History:  Diagnosis Date  . Arthritis   . Diabetes mellitus without complication ([place])   . Hypertension   . Neuropathy   . Obesity   . Sleep apnea     Patient Active Problem List   Diagnosis Date Noted  . Obesity, diabetes, and hypertension syndrome ([place])   . Hypomagnesemia 02/20/2017  . Abnormal EKG 02/20/2017  . Hypophosphatemia 02/20/2017  . CAP (community acquired pneumonia) 02/19/2017  . Hypertension 02/19/2017  . Diabetes mellitus without complication ([place]) 123XX123  . Sleep apnea 02/19/2017  . Neuropathy 02/19/2017  . Prediabetes 01/29/2014  . Obesity 01/29/2014  . BACK PAIN 05/20/2008    Past Surgical History:  Procedure Laterality Date  . ABDOMINAL HYSTERECTOMY    . ABDOMINAL SURGERY    . CARPAL TUNNEL RELEASE Bilateral   . LITHOTRIPSY    . TONSILLECTOMY       OB History    Gravida  1   Para  1   Term  1   Preterm      AB      Living  1     SAB      TAB      Ectopic      Multiple      Live Births               Home Medications    Prior to Admission medications    Medication Sig Start Date End Date Taking? Authorizing Provider  ALPRAZolam Duanne Moron) 0.5 MG tablet Take 0.5 mg by mouth 3 (three) times daily as needed for anxiety.     [provider]  bisoprolol-hydrochlorothiazide (ZIAC) 2.5-6.25 MG tablet Take 1 tablet by mouth at bedtime. 02/21/17   Barton Dubois, MD  cephALEXin (KEFLEX) 500 MG capsule Take 1 capsule (500 mg total) by mouth 3 (three) times daily. 09/15/18   Rolland Porter, MD  gabapentin (NEURONTIN) 300 MG capsule Take 1 capsule (300 mg total) by mouth 2 (two) times daily. 02/21/17   Barton Dubois, MD  glimepiride (AMARYL) 2 MG tablet Take 1 tablet (2 mg total) by mouth daily with breakfast. 02/21/17   Barton Dubois, MD  HYDROcodone-acetaminophen (NORCO/VICODIN) 5-325 MG tablet Take 1 tablet by mouth every 6 (six) hours as needed for moderate pain.    [provider]  Ipratropium-Albuterol (COMBIVENT RESPIMAT) 20-100 MCG/ACT AERS respimat Inhale 1 puff into the lungs every 6 (six) hours as needed for wheezing or shortness of breath. 02/21/17  Barton Dubois, MD  predniSONE (DELTASONE) 20 MG tablet Take 2 tablets by mouth daily times 3 days; then 1 tablet by mouth daily times 3 days; then half tablet by mouth daily times 3 days and stop prednisone. 02/21/17   Barton Dubois, MD  sertraline (ZOLOFT) 100 MG tablet Take 1 tablet (100 mg total) by mouth at bedtime. 02/21/17   Barton Dubois, MD  sulfamethoxazole-trimethoprim (BACTRIM DS) 800-160 MG tablet Take 1 tablet by mouth 2 (two) times daily. 09/15/18   Rolland Porter, MD    Family History Family History  Problem Relation Age of Onset  . Stroke Mother   . Hypertension Mother   . COPD Mother   . Hyperlipidemia Father   . Diabetes Maternal Grandmother   . Heart disease Maternal Grandmother   . Lung cancer Paternal Grandmother     Social History Social History   Tobacco Use  . Smoking status: Former Smoker    Packs/day: 1.00  . Smokeless tobacco: Former Systems developer    Quit date:  11/24/2012  Substance Use Topics  . Alcohol use: Yes    Alcohol/week: 0.0 standard drinks    Comment: occasionally  . Drug use: No     Allergies   Patient has no known allergies.   Review of Systems Review of Systems  All other systems reviewed and are negative.    Physical Exam Updated Vital Signs BP 132/82 (BP Location: Right Arm)   Pulse 79   Temp 98.2 F (36.8 C) (Oral)   Resp 18   Ht 5' 11.75" (1.822 m)   Wt 115.7 kg   SpO2 94%   BMI 34.83 kg/m   Vital signs normal    Physical Exam Vitals signs and nursing note reviewed.  Constitutional:      Appearance: Normal appearance. She is obese.  HENT:     Head: Normocephalic and atraumatic.     Right Ear: External ear normal.     Left Ear: External ear normal.  Eyes:     Extraocular Movements: Extraocular movements intact.     Conjunctiva/sclera: Conjunctivae normal.  Neck:     Musculoskeletal: Normal range of motion.  Cardiovascular:     Rate and Rhythm: Normal rate.  Pulmonary:     Effort: Pulmonary effort is normal. No respiratory distress.  Musculoskeletal:        General: Swelling and tenderness present.     Comments: Pt has redness and swelling of the radial aspect of her right 4th toe with a linear superficial laceration that is most likely self-inflected by patient (she states she "jabbed" it with her toe nail clippers to make it drain).   Skin:    General: Skin is warm and dry.     Findings: Erythema present.  Neurological:     General: No focal deficit present.     Mental Status: She is alert and oriented to person, place, and time.     Cranial Nerves: No cranial nerve deficit.  Psychiatric:        Mood and Affect: Mood normal.        Behavior: Behavior normal.        Thought Content: Thought content normal.        ED Treatments / Results  Labs (all labs ordered are listed, but only abnormal results are displayed) Labs Reviewed  CBG MONITORING, ED - Abnormal; Notable for the following  components:      Result Value   Glucose-Capillary 269 (*)    All other components  within normal limits   Laboratory interpretation all normal except hyperglycemia  EKG None  Radiology No results found.  Procedures Procedures (including critical care time)  Medications Ordered in ED Medications  sulfamethoxazole-trimethoprim (BACTRIM DS) 800-160 MG per tablet 1 tablet (has no administration in time range)  cephALEXin (KEFLEX) capsule 500 mg (has no administration in time range)     Initial Impression / Assessment and Plan / ED Course  I have reviewed the triage vital signs and the nursing notes.  Pertinent labs & imaging results that were available during my care of the patient were reviewed by me and considered in my medical decision making (see chart for details).     Patient may have an early paronychia or cellulitis of her toe, she has diabetes that is not well controlled.  She was started on Septra DS and Keflex.  I do not see any obvious purulent buildup around her nail.  Final Clinical Impressions(s) / ED Diagnoses   Final diagnoses:  Cellulitis of fourth toe of right foot    ED Discharge Orders         Ordered    cephALEXin (KEFLEX) 500 MG capsule  3 times daily     09/15/18 2356    sulfamethoxazole-trimethoprim (BACTRIM DS) 800-160 MG tablet  2 times daily     09/15/18 2356         Plan discharge  Rolland Porter, MD, Barbette Or, MD 09/16/18 0002

## 2018-10-08 ENCOUNTER — Other Ambulatory Visit: Payer: Self-pay | Admitting: *Deleted

## 2018-10-08 DIAGNOSIS — Z20822 Contact with and (suspected) exposure to covid-19: Secondary | ICD-10-CM

## 2018-10-11 ENCOUNTER — Telehealth: Payer: Self-pay | Admitting: Family Medicine

## 2018-10-11 LAB — NOVEL CORONAVIRUS, NAA: SARS-CoV-2, NAA: NOT DETECTED

## 2018-10-11 NOTE — Telephone Encounter (Signed)
Pt called to get COVID results.  Made pt aware they are negative.

## 2018-10-24 ENCOUNTER — Emergency Department (HOSPITAL_COMMUNITY)
Admission: EM | Admit: 2018-10-24 | Discharge: 2018-10-25 | Disposition: A | Discharge status: Home / Self Care | Payer: Self-pay | Attending: Emergency Medicine | Admitting: Emergency Medicine

## 2018-10-24 ENCOUNTER — Other Ambulatory Visit: Payer: Self-pay

## 2018-10-24 ENCOUNTER — Emergency Department (HOSPITAL_COMMUNITY): Payer: Self-pay

## 2018-10-24 DIAGNOSIS — I10 Essential (primary) hypertension: Secondary | ICD-10-CM | POA: Insufficient documentation

## 2018-10-24 DIAGNOSIS — Z87891 Personal history of nicotine dependence: Secondary | ICD-10-CM | POA: Insufficient documentation

## 2018-10-24 DIAGNOSIS — S3210XA Unspecified fracture of sacrum, initial encounter for closed fracture: Secondary | ICD-10-CM | POA: Insufficient documentation

## 2018-10-24 DIAGNOSIS — E1140 Type 2 diabetes mellitus with diabetic neuropathy, unspecified: Secondary | ICD-10-CM | POA: Insufficient documentation

## 2018-10-24 DIAGNOSIS — Y939 Activity, unspecified: Secondary | ICD-10-CM | POA: Insufficient documentation

## 2018-10-24 DIAGNOSIS — E119 Type 2 diabetes mellitus without complications: Secondary | ICD-10-CM | POA: Insufficient documentation

## 2018-10-24 DIAGNOSIS — W07XXXA Fall from chair, initial encounter: Secondary | ICD-10-CM | POA: Insufficient documentation

## 2018-10-24 DIAGNOSIS — Z79899 Other long term (current) drug therapy: Secondary | ICD-10-CM | POA: Insufficient documentation

## 2018-10-24 DIAGNOSIS — Y9289 Other specified places as the place of occurrence of the external cause: Secondary | ICD-10-CM | POA: Insufficient documentation

## 2018-10-24 DIAGNOSIS — Y999 Unspecified external cause status: Secondary | ICD-10-CM | POA: Insufficient documentation

## 2018-10-24 MED ORDER — ONDANSETRON HCL 4 MG/2ML IJ SOLN
4.0000 mg | Freq: Once | INTRAMUSCULAR | Status: AC
  Administered 2018-10-25: 4 mg via INTRAVENOUS
  Filled 2018-10-24: qty 2

## 2018-10-24 MED ORDER — HYDROMORPHONE HCL 1 MG/ML IJ SOLN
1.0000 mg | Freq: Once | INTRAMUSCULAR | Status: AC
  Administered 2018-10-25: 1 mg via INTRAVENOUS
  Filled 2018-10-24: qty 1

## 2018-10-24 MED ORDER — METHOCARBAMOL 500 MG PO TABS
1000.0000 mg | ORAL_TABLET | Freq: Once | ORAL | Status: DC
  Filled 2018-10-24: qty 2

## 2018-10-24 NOTE — ED Triage Notes (Signed)
Patient was in a chair when she fell. Patient complaining of back pain. Patient states no loss of consciousness. Patient states she is having severe tail bone pain. Patient states she landed on a hardwood floor.

## 2018-10-24 NOTE — ED Provider Notes (Addendum)
[facility] EMERGENCY DEPARTMENT Provider Note   CSN: [id number] Arrival date & time: 10/24/18  2105     History   Chief Complaint Chief Complaint  Patient presents with  . Fall    HPI Kimberly Marks is a 48 y.o. female.     HPI   Kimberly Marks is a 48 y.o. female, with a history of chronic back pain, DM, arthritis, HTN, obesity, presenting to the ED for evaluation following a fall that occurred around 7:30 PM this evening. Patient was sitting in a chair on top of a wooden platform, chair slipped off the platform, and patient fell backward, landing on her back on a hardwood floor. She is complaining of lower back and buttock pain, severe, nonradiating. When the head of the chair and platform are both taken into account, she estimates total fall to be about 6 feet. She was ambulatory following the fall. She denies LOC, neck pain, upper back pain, head injury, nausea/vomiting, numbness, weakness, saddle anesthesias, shortness of breath, or any other complaints.       Past Medical History:  Diagnosis Date  . Arthritis   . Diabetes mellitus without complication ([place])   . Hypertension   . Neuropathy   . Obesity   . Sleep apnea     Patient Active Problem List   Diagnosis Date Noted  . Obesity, diabetes, and hypertension syndrome ([place])   . Hypomagnesemia 02/20/2017  . Abnormal EKG 02/20/2017  . Hypophosphatemia 02/20/2017  . CAP (community acquired pneumonia) 02/19/2017  . Hypertension 02/19/2017  . Diabetes mellitus without complication ([place]) 123XX123  . Sleep apnea 02/19/2017  . Neuropathy 02/19/2017  . Prediabetes 01/29/2014  . Obesity 01/29/2014  . BACK PAIN 05/20/2008    Past Surgical History:  Procedure Laterality Date  . ABDOMINAL HYSTERECTOMY    . ABDOMINAL SURGERY    . CARPAL TUNNEL RELEASE Bilateral   . LITHOTRIPSY    . TONSILLECTOMY       OB History    Gravida  1   Para  1   Term  1   Preterm      AB      Living  1     SAB     TAB      Ectopic      Multiple      Live Births               Home Medications    Prior to Admission medications   Medication Sig Start Date End Date Taking? Authorizing Provider  ALPRAZolam Duanne Moron) 0.5 MG tablet Take 0.5 mg by mouth 3 (three) times daily as needed for anxiety.    Yes [provider]  amoxicillin (AMOXIL) 500 MG capsule Take 500 mg by mouth 3 (three) times daily. 10 day course starting on 10/24/2018   Yes [provider]  bisoprolol-hydrochlorothiazide (ZIAC) 2.5-6.25 MG tablet Take 1 tablet by mouth at bedtime. 02/21/17  Yes Barton Dubois, MD  gabapentin (NEURONTIN) 300 MG capsule Take 1 capsule (300 mg total) by mouth 2 (two) times daily. Patient taking differently: Take 300 mg by mouth at bedtime.  02/21/17  Yes Barton Dubois, MD  glimepiride (AMARYL) 2 MG tablet Take 1 tablet (2 mg total) by mouth daily with breakfast. 02/21/17  Yes Barton Dubois, MD  HYDROcodone-acetaminophen (NORCO/VICODIN) 5-325 MG tablet Take 1 tablet by mouth every 6 (six) hours as needed for moderate pain.   Yes [provider]  ibuprofen (ADVIL) 200 MG tablet Take 600  mg by mouth every 6 (six) hours as needed for mild pain or moderate pain (for dental pain).    Yes [provider]  Ipratropium-Albuterol (COMBIVENT RESPIMAT) 20-100 MCG/ACT AERS respimat Inhale 1 puff into the lungs every 6 (six) hours as needed for wheezing or shortness of breath. 02/21/17  Yes Barton Dubois, MD  sertraline (ZOLOFT) 100 MG tablet Take 1 tablet (100 mg total) by mouth at bedtime. 02/21/17  Yes Barton Dubois, MD  cephALEXin (KEFLEX) 500 MG capsule Take 1 capsule (500 mg total) by mouth 3 (three) times daily. Patient not taking: Reported on 10/24/2018 09/15/18   Rolland Porter, MD  lidocaine (LIDODERM) 5 % Place 1 patch onto the skin daily. Remove & Discard patch within 12 hours or as directed by MD 10/25/18   Diannah Rindfleisch C, PA-C  methocarbamol (ROBAXIN) 500 MG tablet Take 1  tablet (500 mg total) by mouth 2 (two) times daily. 10/25/18   Lavene Penagos C, PA-C  oxyCODONE-acetaminophen (PERCOCET/ROXICET) 5-325 MG tablet Take 1-2 tablets by mouth every 6 (six) hours as needed for severe pain. 10/25/18   Caylin Nass C, PA-C  sulfamethoxazole-trimethoprim (BACTRIM DS) 800-160 MG tablet Take 1 tablet by mouth 2 (two) times daily. Patient not taking: Reported on 10/24/2018 09/15/18   Rolland Porter, MD    Family History Family History  Problem Relation Age of Onset  . Stroke Mother   . Hypertension Mother   . COPD Mother   . Hyperlipidemia Father   . Diabetes Maternal Grandmother   . Heart disease Maternal Grandmother   . Lung cancer Paternal Grandmother     Social History Social History   Tobacco Use  . Smoking status: Former Smoker    Packs/day: 1.00  . Smokeless tobacco: Former Systems developer    Quit date: 11/24/2012  Substance Use Topics  . Alcohol use: Yes    Alcohol/week: 0.0 standard drinks    Comment: occasionally  . Drug use: No     Allergies   Patient has no known allergies.   Review of Systems Review of Systems  Respiratory: Negative for shortness of breath.   Cardiovascular: Negative for chest pain.  Gastrointestinal: Negative for abdominal pain, nausea and vomiting.  Musculoskeletal: Positive for back pain.  Neurological: Negative for syncope, weakness and numbness.  All other systems reviewed and are negative.    Physical Exam Updated Vital Signs BP 127/86 (BP Location: Left Arm)   Pulse 74   Temp 99 F (37.2 C) (Oral)   Resp 18   Ht 5\' 11"  (1.803 m)   Wt 113.4 kg   SpO2 99%   BMI 34.87 kg/m   Physical Exam Vitals signs and nursing note reviewed.  Constitutional:      General: She is not in acute distress.    Appearance: She is well-developed. She is not diaphoretic.  HENT:     Head: Normocephalic and atraumatic.     Mouth/Throat:     Mouth: Mucous membranes are moist.     Pharynx: Oropharynx is clear.  Eyes:      Conjunctiva/sclera: Conjunctivae normal.  Neck:     Musculoskeletal: Normal range of motion and neck supple.  Cardiovascular:     Rate and Rhythm: Normal rate and regular rhythm.     Pulses: Normal pulses.          Radial pulses are 2+ on the right side and 2+ on the left side.       Posterior tibial pulses are 2+ on the right side  and 2+ on the left side.     Comments: Tactile temperature in the extremities appropriate and equal bilaterally. Pulmonary:     Effort: Pulmonary effort is normal. No respiratory distress.     Breath sounds: Normal breath sounds.  Abdominal:     Palpations: Abdomen is soft.     Tenderness: There is no abdominal tenderness. There is no guarding.  Musculoskeletal:     Right lower leg: No edema.     Left lower leg: No edema.     Comments: Tenderness in the midline sacral region. Patient also has tenderness in the bilateral lumbar musculature.  No midline spinal tenderness in the thoracic or cervical spine.  Full range of motion in the cervical spine without pain or noted difficulty.  Skin:    General: Skin is warm and dry.  Neurological:     Mental Status: She is alert.     Comments: Sensation to light touch grossly intact in the bilateral lower extremities upon initial exam. Patient also has motor function intact in the bilateral lower extremities.  Proper strength testing was deferred until after pain management.  Sensation grossly intact to light touch in the lower extremities bilaterally. No saddle anesthesias. Strength 5/5 in the bilateral lower extremities. No noted gait deficit.  Psychiatric:        Mood and Affect: Mood and affect normal.        Speech: Speech normal.        Behavior: Behavior normal.      ED Treatments / Results  Labs (all labs ordered are listed, but only abnormal results are displayed) Labs Reviewed - No data to display  EKG None  Radiology Dg Lumbar Spine Complete  Result Date: 10/25/2018 CLINICAL DATA:  Fall  with midline tenderness. Fall from chair. EXAM: LUMBAR SPINE - COMPLETE 4+ VIEW COMPARISON:  Lumbar spine MRI 03/06/2013 FINDINGS: The alignment is maintained. Vertebral body heights are normal. There is no listhesis. The posterior elements are intact. Disc spaces are preserved. Mild facet hypertrophy in the lower lumbar spine. There is minimal cortical buckling of the mid sacrum, best appreciated on L5-S1 spot view. No lumbar spine fracture. Sacroiliac joints are symmetric and normal. IMPRESSION: 1. No fracture or subluxation of the lumbar spine. 2. Minimal cortical buckling of the mid sacrum may represent a nondisplaced fracture. Electronically Signed   By: Keith Rake M.D.   On: 10/25/2018 00:18   Dg Hips Bilat W Or Wo Pelvis 3-4 Views  Result Date: 10/25/2018 CLINICAL DATA:  Post fall from chair with tenderness on sacrum and left iliac crest. EXAM: DG HIP (WITH OR WITHOUT PELVIS) 3-4V BILAT COMPARISON:  None. FINDINGS: The cortical margins of the bony pelvis are intact. Cortical irregularity of the sacrum was better appreciated on lateral view performed with concurrent lumbar spine. No fracture. Cortical margins of the sacrum and coccyx are intact on lateral view obtained on concurrent lumbar spine. Pubic symphysis and sacroiliac joints are congruent. Both femoral heads are well-seated in the respective acetabula. IMPRESSION: 1. Cortical irregularity of the sacrum, was appreciated on lateral view performed with concurrent lumbar spine, possible sacral fracture. 2. No other fracture of the pelvis or hips. Electronically Signed   By: Keith Rake M.D.   On: 10/25/2018 00:21    Procedures Procedures (including critical care time)  Medications Ordered in ED Medications  oxyCODONE-acetaminophen (PERCOCET/ROXICET) 5-325 MG per tablet 1 tablet (has no administration in time range)  ibuprofen (ADVIL) tablet 600 mg (has no administration in  time range)  HYDROmorphone (DILAUDID) injection 1 mg (1  mg Intravenous Given 10/25/18 0007)  ondansetron (ZOFRAN) injection 4 mg (4 mg Intravenous Given 10/25/18 0007)  methocarbamol (ROBAXIN) tablet 500 mg (500 mg Oral Given 10/25/18 0023)     Initial Impression / Assessment and Plan / ED Course  I have reviewed the triage vital signs and the nursing notes.  Pertinent labs & imaging results that were available during my care of the patient were reviewed by me and considered in my medical decision making (see chart for details).        Patient presents for evaluation following a fall.  No focal neuro deficits noted. NEXUS negative for need for c-spine imaging. We were able to get at least adequate pain management here in the ED. I ambulated the patient myself and she required no assistance during ambulation. She will follow-up with neurosurgery in the office.  The patient was given instructions for home care as well as return precautions. Patient voices understanding of these instructions, accepts the plan, and is comfortable with discharge.   Findings and plan of care discussed with Ezequiel Essex, MD.   Final Clinical Impressions(s) / ED Diagnoses   Final diagnoses:  Closed fracture of sacrum, unspecified portion of sacrum, initial encounter [facility])    ED Discharge Orders         Ordered    oxyCODONE-acetaminophen (PERCOCET/ROXICET) 5-325 MG tablet  Every 6 hours PRN     10/25/18 0118    methocarbamol (ROBAXIN) 500 MG tablet  2 times daily     10/25/18 0118    lidocaine (LIDODERM) 5 %  Every 24 hours     10/25/18 0118           Lorayne Bender, PA-C 10/25/18 0129    Lorayne Bender, PA-C 10/25/18 0134    Ezequiel Essex, MD 10/26/18 0725

## 2018-10-25 MED ORDER — METHOCARBAMOL 500 MG PO TABS: 500.0000 mg | ORAL_TABLET | Freq: Two times a day (BID) | ORAL | 0 refills | Status: DC

## 2018-10-25 MED ORDER — METHOCARBAMOL 500 MG PO TABS
500.0000 mg | ORAL_TABLET | Freq: Once | ORAL | Status: AC
  Administered 2018-10-25: 500 mg via ORAL

## 2018-10-25 MED ORDER — LIDOCAINE 5 % EX PTCH: 1.0000 | MEDICATED_PATCH | CUTANEOUS | 0 refills | Status: DC

## 2018-10-25 MED ORDER — OXYCODONE-ACETAMINOPHEN 5-325 MG PO TABS: 1.0000 | ORAL_TABLET | Freq: Four times a day (QID) | ORAL | 0 refills | Status: DC | PRN

## 2018-10-25 MED ORDER — IBUPROFEN 400 MG PO TABS
600.0000 mg | ORAL_TABLET | Freq: Once | ORAL | Status: AC
  Administered 2018-10-25: 02:00:00 600 mg via ORAL
  Filled 2018-10-25: qty 2

## 2018-10-25 MED ORDER — OXYCODONE-ACETAMINOPHEN 5-325 MG PO TABS
1.0000 | ORAL_TABLET | Freq: Once | ORAL | Status: AC
  Administered 2018-10-25: 02:00:00 1 via ORAL
  Filled 2018-10-25: qty 1

## 2018-10-25 NOTE — Discharge Instructions (Signed)
Expect your soreness to increase over the next 2-3 days. Take it easy, but do not lay around too much as this may make any stiffness worse.  Antiinflammatory medications: Take 600 mg of ibuprofen every 6 hours or 440 mg (over the counter dose) to 500 mg (prescription dose) of naproxen every 12 hours for the next 3 days. After this time, these medications may be used as needed for pain. Take these medications with food to avoid upset stomach. Choose only one of these medications, do not take them together. Acetaminophen (generic for Tylenol): Should you continue to have additional pain while taking the ibuprofen or naproxen, you may add in acetaminophen as needed. Your daily total maximum amount of acetaminophen from all sources should be limited to 4000mg /day for persons without liver problems, or 2000mg /day for those with liver problems. Percocet: May take Percocet (oxycodone-acetaminophen) as needed for severe pain.   Do not drive or perform other dangerous activities while taking this medication as it can cause drowsiness as well as changes in reaction time and judgement.  Please note that each pill of Percocet contains 325 mg of acetaminophen (generic for Tylenol) and the above dosage limits apply. Methocarbamol: Methocarbamol (generic for Robaxin) is a muscle relaxer and can help relieve stiff muscles or muscle spasms.  Do not drive or perform other dangerous activities while taking this medication as it can cause drowsiness as well as changes in reaction time and judgement. Lidocaine patches: These are available via either prescription or over-the-counter. The over-the-counter option may be more economical one and are likely just as effective. There are multiple over-the-counter brands, such as Salonpas. Exercises: Be sure to perform the attached exercises starting with three times a week and working up to performing them daily. This is an essential part of preventing long term problems.  Follow up:  Follow up with the neurosurgical specialist for any further management of this issue. Return: Return to the ED should symptoms worsen.  For prescription assistance, may try using prescription discount sites or apps, such as goodrx.com

## 2018-12-25 ENCOUNTER — Other Ambulatory Visit: Payer: Self-pay

## 2018-12-25 ENCOUNTER — Ambulatory Visit: Payer: HRSA Program | Attending: Internal Medicine

## 2018-12-25 DIAGNOSIS — Z20828 Contact with and (suspected) exposure to other viral communicable diseases: Secondary | ICD-10-CM | POA: Diagnosis not present

## 2018-12-25 DIAGNOSIS — Z20822 Contact with and (suspected) exposure to covid-19: Secondary | ICD-10-CM

## 2018-12-26 LAB — NOVEL CORONAVIRUS, NAA: SARS-CoV-2, NAA: NOT DETECTED

## 2019-01-13 ENCOUNTER — Other Ambulatory Visit: Payer: Self-pay

## 2019-01-13 ENCOUNTER — Ambulatory Visit: Payer: HRSA Program | Attending: Internal Medicine

## 2019-01-13 DIAGNOSIS — Z20822 Contact with and (suspected) exposure to covid-19: Secondary | ICD-10-CM | POA: Diagnosis present

## 2019-01-14 LAB — NOVEL CORONAVIRUS, NAA: SARS-CoV-2, NAA: NOT DETECTED

## 2019-03-15 ENCOUNTER — Ambulatory Visit: Payer: Self-pay | Attending: Internal Medicine

## 2019-03-15 DIAGNOSIS — Z23 Encounter for immunization: Secondary | ICD-10-CM

## 2019-03-15 NOTE — Progress Notes (Signed)
   Covid-19 Vaccination Clinic  Name:  Kimberly Marks    MRN: [id number] DOB: [birth year]  03/15/2019  Kimberly Marks was observed post Covid-19 immunization for 15 minutes without incident. She was provided with Vaccine Information Sheet and instruction to access the V-Safe system.   Kimberly Marks was instructed to call 911 with any severe reactions post vaccine: Marland Kitchen Difficulty breathing  . Swelling of face and throat  . A fast heartbeat  . A bad rash all over body  . Dizziness and weakness   Immunizations Administered    Name Date Dose VIS Date Route   Moderna COVID-19 Vaccine 03/15/2019 11:52 AM 0.5 mL 12/03/2018 Intramuscular   Manufacturer: Moderna   Lot: [id number]   [id number]

## 2019-04-16 ENCOUNTER — Ambulatory Visit: Payer: Self-pay | Attending: Internal Medicine

## 2019-04-16 DIAGNOSIS — Z23 Encounter for immunization: Secondary | ICD-10-CM

## 2019-04-16 NOTE — Progress Notes (Signed)
   Covid-19 Vaccination Clinic  Name:  ANISTY RIAS    MRN: [id number] DOB: 1970/04/16  04/16/2019  Ms. Speegle was observed post Covid-19 immunization for 15 minutes without incident. She was provided with Vaccine Information Sheet and instruction to access the V-Safe system.   Ms. Zeid was instructed to call 911 with any severe reactions post vaccine: Marland Kitchen Difficulty breathing  . Swelling of face and throat  . A fast heartbeat  . A bad rash all over body  . Dizziness and weakness   Immunizations Administered    Name Date Dose VIS Date Route   Moderna COVID-19 Vaccine 04/16/2019  9:32 AM 0.5 mL 12/03/2018 Intramuscular   Manufacturer: Moderna   Lot: [id number]   [id number]

## 2020-04-06 IMAGING — DX DG LUMBAR SPINE COMPLETE 4+V
5 series · 5 of 5 positions shown · non-contrast
Comparison: Lumbar spine MRI 03/06/2013

CLINICAL DATA: Fall with midline tenderness. Fall from chair.

EXAM:
LUMBAR SPINE - COMPLETE 4+ VIEW

[l-spine ap]
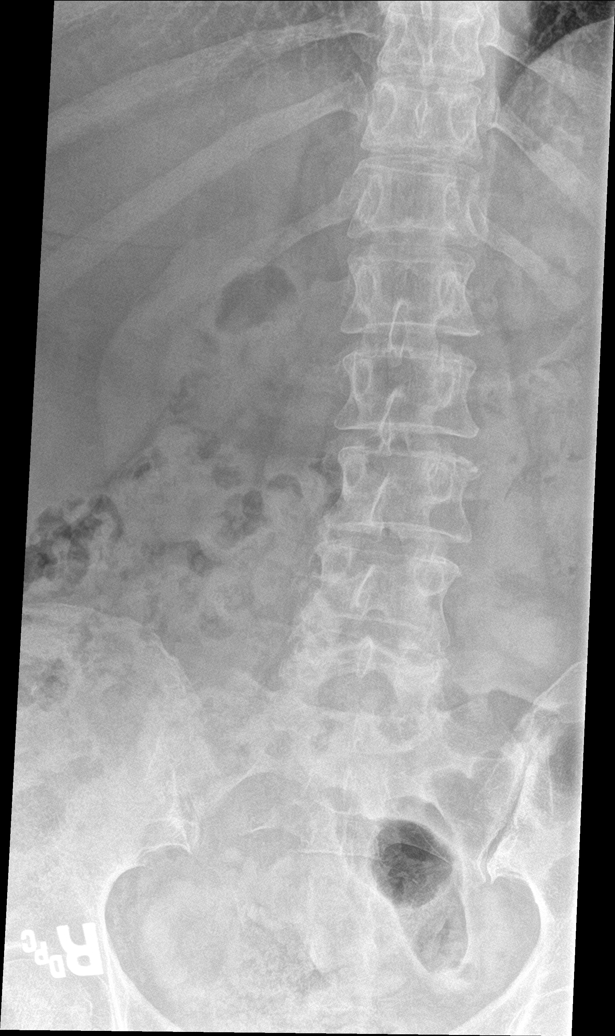

[l-spine obl (1 of 2)]
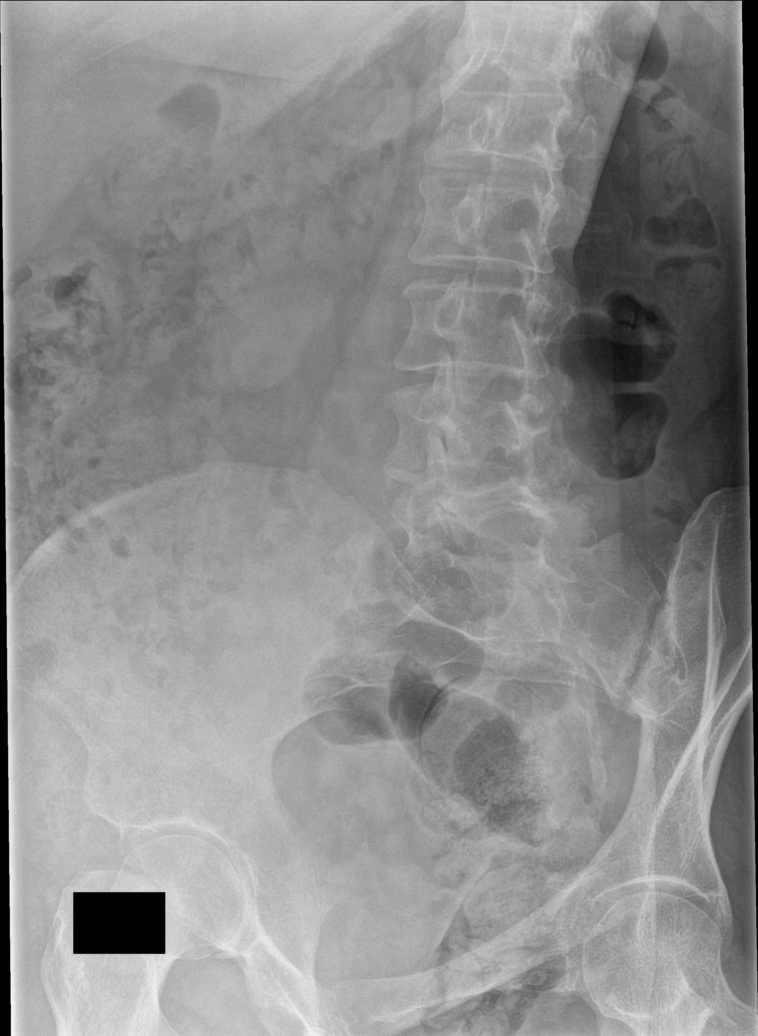

[l-spine obl (2 of 2)]
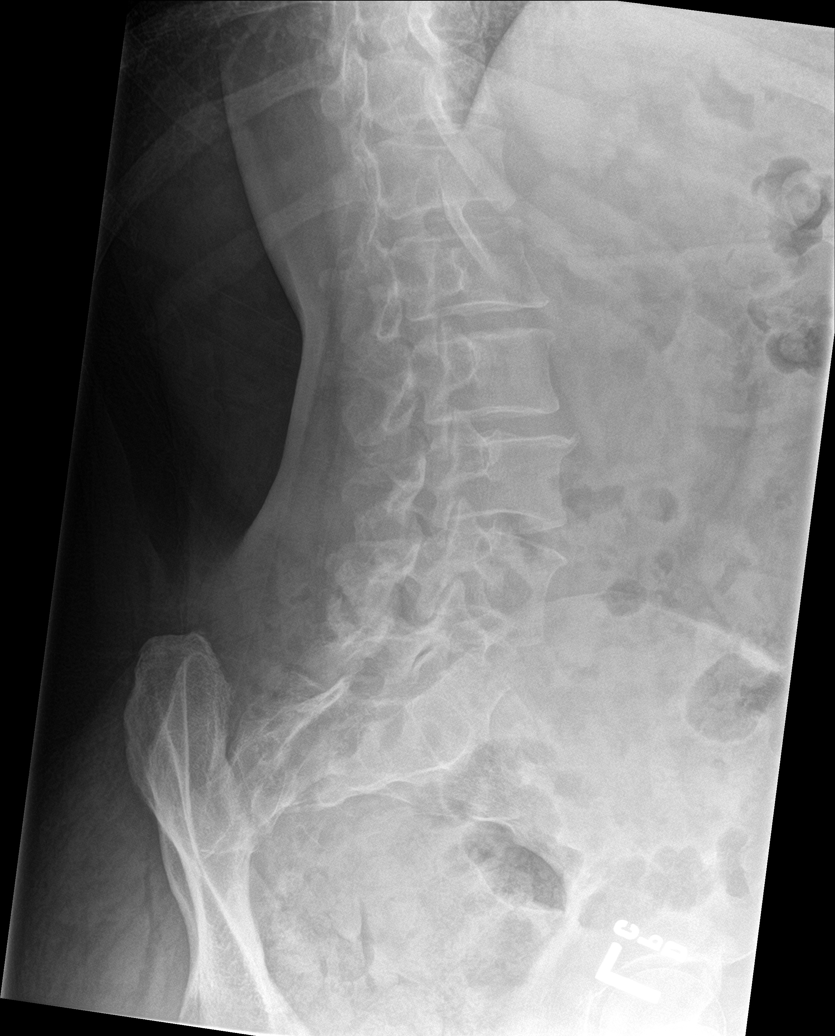

[l-spine lat]
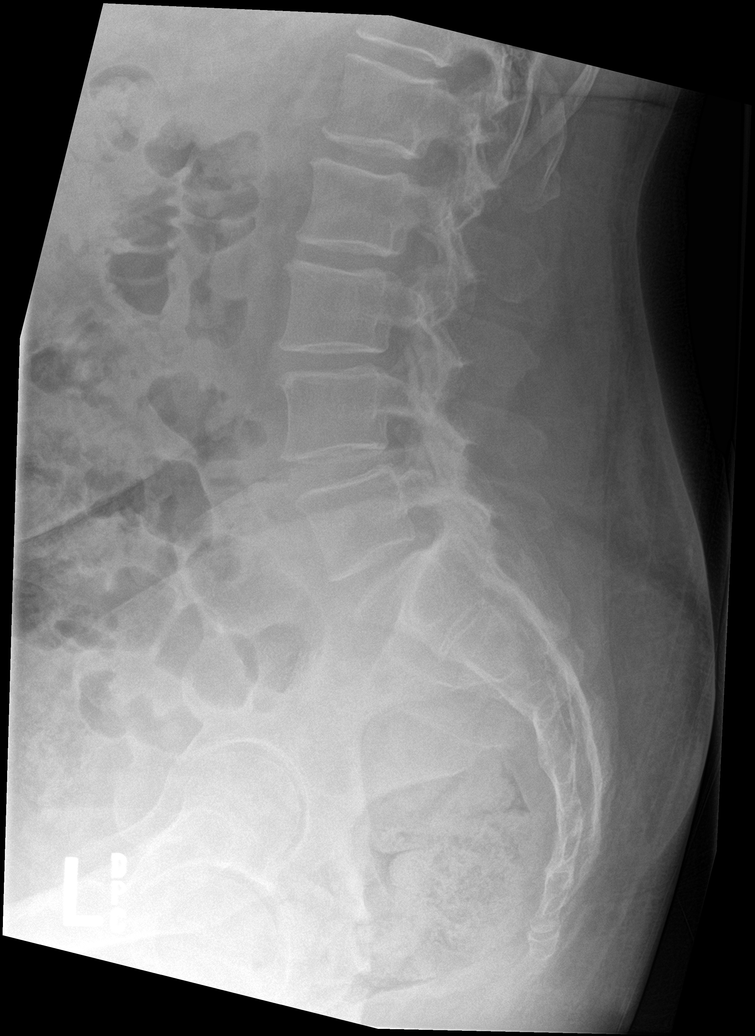

[l-spine spot]
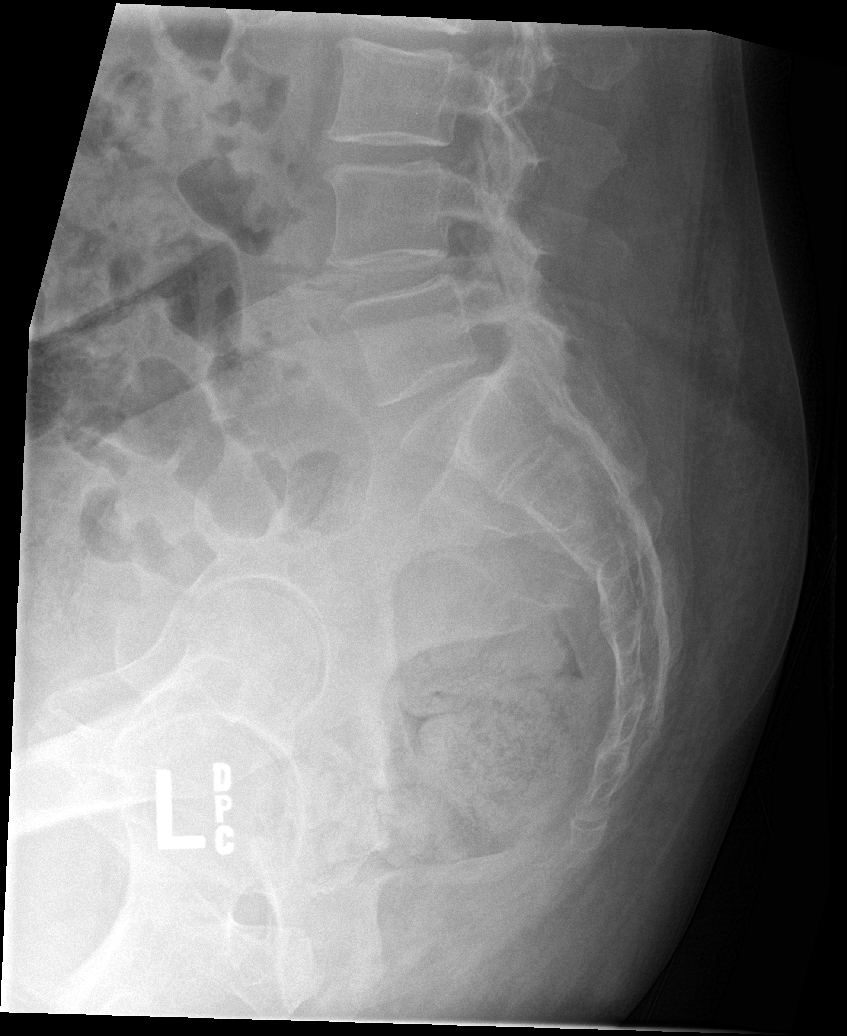

[5 of 5 positions shown; findings below may reference images not displayed]

FINDINGS: The alignment is maintained. Vertebral body heights are normal.
There is no listhesis. The posterior elements are intact. Disc
spaces are preserved. Mild facet hypertrophy in the lower lumbar
spine. There is minimal cortical buckling of the mid sacrum, best
appreciated on L5-S1 spot view. No lumbar spine fracture. Sacroiliac
joints are symmetric and normal.
IMPRESSION: 1. No fracture or subluxation of the lumbar spine.
2. Minimal cortical buckling of the mid sacrum may represent a
nondisplaced fracture.

## 2021-02-27 ENCOUNTER — Ambulatory Visit
Admission: EM | Admit: 2021-02-27 | Discharge: 2021-02-27 | Disposition: A | Discharge status: Home / Self Care | Payer: BC Managed Care – PPO | Attending: Family Medicine | Admitting: Family Medicine

## 2021-02-27 ENCOUNTER — Other Ambulatory Visit: Payer: Self-pay

## 2021-02-27 DIAGNOSIS — K047 Periapical abscess without sinus: Secondary | ICD-10-CM

## 2021-02-27 MED ORDER — LIDOCAINE VISCOUS HCL 2 % MT SOLN: 10.0000 mL | OROMUCOSAL | 0 refills | Status: DC | PRN

## 2021-02-27 MED ORDER — AMOXICILLIN-POT CLAVULANATE 875-125 MG PO TABS: 1.0000 | ORAL_TABLET | Freq: Two times a day (BID) | ORAL | 0 refills | Status: DC

## 2021-02-27 NOTE — ED Triage Notes (Signed)
Patient states she does not know why her head, ears, nose pressure and throat and all of her teeth hurt since Friday  Pt states she has tried Ibuprofen and  Hydrocodone  Denies Fever

## 2021-02-27 NOTE — ED Provider Notes (Signed)
[facility]    CSN: [id number] Arrival date & time: 02/27/21  0017      History   Chief Complaint Chief Complaint  Patient presents with   Facial Pain    Headache, sinus issues    HPI Kimberly Marks is a 51 y.o. female.   Presenting today with 2-day history of progressively worsening headache, dental pain that is radiating up into his sinuses and ears.  Denies fever, difficulty swallowing, difficulty breathing, drainage or bleeding into the mouth.  Has known dental issues on the right upper area and thinks she may have dental infection.  Trying ibuprofen and hydrocodone with very minimal relief, states her pain is severe.   Past Medical History:  Diagnosis Date   Arthritis    Diabetes mellitus without complication ([place])    Hypertension    Neuropathy    Obesity    Sleep apnea     Patient Active Problem List   Diagnosis Date Noted   Obesity, diabetes, and hypertension syndrome ([place])    Hypomagnesemia 02/20/2017   Abnormal EKG 02/20/2017   Hypophosphatemia 02/20/2017   CAP (community acquired pneumonia) 02/19/2017   Hypertension 02/19/2017   Diabetes mellitus without complication ([place]) 49/44/9675   Sleep apnea 02/19/2017   Neuropathy 02/19/2017   Prediabetes 01/29/2014   Obesity 01/29/2014   BACK PAIN 05/20/2008    Past Surgical History:  Procedure Laterality Date   ABDOMINAL HYSTERECTOMY     ABDOMINAL SURGERY     CARPAL TUNNEL RELEASE Bilateral    LITHOTRIPSY     TONSILLECTOMY      OB History     Gravida  1   Para  1   Term  1   Preterm      AB      Living  1      SAB      IAB      Ectopic      Multiple      Live Births               Home Medications    Prior to Admission medications   Medication Sig Start Date End Date Taking? Authorizing Provider  amoxicillin-clavulanate (AUGMENTIN) 875-125 MG tablet Take 1 tablet by mouth every 12 (twelve) hours. 02/27/21  Yes Volney American, PA-C  lidocaine (XYLOCAINE)  2 % solution Use as directed 10 mLs in the mouth or throat every 3 (three) hours as needed for mouth pain. 02/27/21  Yes Volney American, PA-C  ALPRAZolam Duanne Moron) 0.5 MG tablet Take 0.5 mg by mouth 3 (three) times daily as needed for anxiety.     [provider]  amoxicillin (AMOXIL) 500 MG capsule Take 500 mg by mouth 3 (three) times daily. 10 day course starting on 10/24/2018    [provider]  bisoprolol-hydrochlorothiazide [facility]) 2.5-6.25 MG tablet Take 1 tablet by mouth at bedtime. 02/21/17   Barton Dubois, MD  cephALEXin (KEFLEX) 500 MG capsule Take 1 capsule (500 mg total) by mouth 3 (three) times daily. Patient not taking: Reported on 10/24/2018 09/15/18   Rolland Porter, MD  gabapentin (NEURONTIN) 300 MG capsule Take 1 capsule (300 mg total) by mouth 2 (two) times daily. Patient taking differently: Take 300 mg by mouth at bedtime. 02/21/17   Barton Dubois, MD  glimepiride (AMARYL) 2 MG tablet Take 1 tablet (2 mg total) by mouth daily with breakfast. 02/21/17   Barton Dubois, MD  HYDROcodone-acetaminophen (NORCO/VICODIN) 5-325 MG tablet Take 1 tablet by mouth every  6 (six) hours as needed for moderate pain.    [provider]  ibuprofen (ADVIL) 200 MG tablet Take 600 mg by mouth every 6 (six) hours as needed for mild pain or moderate pain (for dental pain).     [provider]  Ipratropium-Albuterol (COMBIVENT RESPIMAT) 20-100 MCG/ACT AERS respimat Inhale 1 puff into the lungs every 6 (six) hours as needed for wheezing or shortness of breath. 02/21/17   Barton Dubois, MD  lidocaine (LIDODERM) 5 % Place 1 patch onto the skin daily. Remove & Discard patch within 12 hours or as directed by MD 10/25/18   Joy, Shawn C, PA-C  methocarbamol (ROBAXIN) 500 MG tablet Take 1 tablet (500 mg total) by mouth 2 (two) times daily. 10/25/18   Joy, Shawn C, PA-C  oxyCODONE-acetaminophen (PERCOCET/ROXICET) 5-325 MG tablet Take 1-2 tablets by mouth every 6 (six) hours as  needed for severe pain. 10/25/18   Joy, Shawn C, PA-C  sertraline (ZOLOFT) 100 MG tablet Take 1 tablet (100 mg total) by mouth at bedtime. 02/21/17   Barton Dubois, MD  sulfamethoxazole-trimethoprim (BACTRIM DS) 800-160 MG tablet Take 1 tablet by mouth 2 (two) times daily. Patient not taking: Reported on 10/24/2018 09/15/18   Rolland Porter, MD    Family History Family History  Problem Relation Age of Onset   Stroke Mother    Hypertension Mother    COPD Mother    Hyperlipidemia Father    Diabetes Maternal Grandmother    Heart disease Maternal Grandmother    Lung cancer Paternal Grandmother     Social History Social History   Tobacco Use   Smoking status: Former    Packs/day: 1.00    Types: Cigarettes   Smokeless tobacco: Former    Quit date: 11/24/2012  Vaping Use   Vaping Use: Former  Substance Use Topics   Alcohol use: Yes    Alcohol/week: 0.0 standard drinks    Comment: occasionally   Drug use: No     Allergies   Patient has no known allergies.   Review of Systems Review of Systems Per HPI  Physical Exam Triage Vital Signs ED Triage Vitals  Enc Vitals Group     BP 02/27/21 1109 (!) 142/95     Pulse Rate 02/27/21 1107 96     Resp 02/27/21 1107 18     Temp 02/27/21 1107 97.9 F (36.6 C)     Temp Source 02/27/21 1107 Oral     SpO2 02/27/21 1107 95 %     Weight --      Height --      Head Circumference --      Peak Flow --      Pain Score 02/27/21 1108 10     Pain Loc --      Pain Edu? --      Excl. in [place]? --    No data found.  Updated Vital Signs BP (!) 142/95 (BP Location: Right Arm)    Pulse 96    Temp 97.9 F (36.6 C) (Oral)    Resp 18    SpO2 95%   Visual Acuity Right Eye Distance:   Left Eye Distance:   Bilateral Distance:    Right Eye Near:   Left Eye Near:    Bilateral Near:     Physical Exam Vitals and nursing note reviewed.  Constitutional:      Comments: Appears in significant pain, moaning during exam  HENT:     Head:  Atraumatic.  Nose: Nose normal.     Mouth/Throat:     Mouth: Mucous membranes are moist.     Comments: Ulcerated and abscessed incisor right upper with surrounding erythema, edema Eyes:     Extraocular Movements: Extraocular movements intact.     Conjunctiva/sclera: Conjunctivae normal.  Cardiovascular:     Rate and Rhythm: Normal rate and regular rhythm.     Heart sounds: Normal heart sounds.  Pulmonary:     Effort: Pulmonary effort is normal.     Breath sounds: Normal breath sounds.  Musculoskeletal:        General: Normal range of motion.     Cervical back: Normal range of motion and neck supple.  Skin:    General: Skin is warm and dry.  Neurological:     Mental Status: She is alert and oriented to person, place, and time.  Psychiatric:        Mood and Affect: Mood normal.        Thought Content: Thought content normal.        Judgment: Judgment normal.     UC Treatments / Results  Labs (all labs ordered are listed, but only abnormal results are displayed) Labs Reviewed - No data to display  EKG   Radiology No results found.  Procedures Procedures (including critical care time)  Medications Ordered in UC Medications - No data to display  Initial Impression / Assessment and Plan / UC Course  I have reviewed the triage vital signs and the nursing notes.  Pertinent labs & imaging results that were available during my care of the patient were reviewed by me and considered in my medical decision making (see chart for details).     Treat with Augmentin, viscous lidocaine.  Close dental follow-up recommended as soon as possible.  Work note given.  Return for acutely worsening symptoms.  Final Clinical Impressions(s) / UC Diagnoses   Final diagnoses:  Dental infection   Discharge Instructions   None    ED Prescriptions     Medication Sig Dispense Auth. Provider   amoxicillin-clavulanate (AUGMENTIN) 875-125 MG tablet Take 1 tablet by mouth every 12  (twelve) hours. 14 tablet Volney American, [place]   lidocaine (XYLOCAINE) 2 % solution Use as directed 10 mLs in the mouth or throat every 3 (three) hours as needed for mouth pain. 100 mL Volney American, [place]      PDMP not reviewed this encounter.   Volney American, [place] 02/27/21 1143

## 2021-05-15 ENCOUNTER — Other Ambulatory Visit: Payer: Self-pay

## 2021-05-15 ENCOUNTER — Encounter (HOSPITAL_COMMUNITY): Payer: Self-pay | Admitting: *Deleted

## 2021-05-15 ENCOUNTER — Emergency Department (HOSPITAL_COMMUNITY)
Admission: EM | Admit: 2021-05-15 | Discharge: 2021-05-15 | Disposition: A | Discharge status: Home / Self Care | Payer: BC Managed Care – PPO | Attending: Emergency Medicine | Admitting: Emergency Medicine

## 2021-05-15 DIAGNOSIS — J0100 Acute maxillary sinusitis, unspecified: Secondary | ICD-10-CM | POA: Insufficient documentation

## 2021-05-15 DIAGNOSIS — Z79899 Other long term (current) drug therapy: Secondary | ICD-10-CM | POA: Diagnosis not present

## 2021-05-15 DIAGNOSIS — E119 Type 2 diabetes mellitus without complications: Secondary | ICD-10-CM | POA: Insufficient documentation

## 2021-05-15 DIAGNOSIS — Z7984 Long term (current) use of oral hypoglycemic drugs: Secondary | ICD-10-CM | POA: Insufficient documentation

## 2021-05-15 DIAGNOSIS — I10 Essential (primary) hypertension: Secondary | ICD-10-CM | POA: Insufficient documentation

## 2021-05-15 DIAGNOSIS — R519 Headache, unspecified: Secondary | ICD-10-CM | POA: Diagnosis present

## 2021-05-15 MED ORDER — AMOXICILLIN-POT CLAVULANATE 875-125 MG PO TABS
1.0000 | ORAL_TABLET | Freq: Once | ORAL | Status: AC
  Administered 2021-05-15: 1 via ORAL
  Filled 2021-05-15: qty 1

## 2021-05-15 MED ORDER — ONDANSETRON 4 MG PO TBDP
4.0000 mg | ORAL_TABLET | Freq: Once | ORAL | Status: AC
  Administered 2021-05-15: 4 mg via ORAL
  Filled 2021-05-15: qty 1

## 2021-05-15 MED ORDER — HYDROCODONE-ACETAMINOPHEN 5-325 MG PO TABS: 1.0000 | ORAL_TABLET | Freq: Four times a day (QID) | ORAL | 0 refills | Status: AC | PRN

## 2021-05-15 MED ORDER — AMOXICILLIN-POT CLAVULANATE 875-125 MG PO TABS: 1.0000 | ORAL_TABLET | Freq: Two times a day (BID) | ORAL | 0 refills | Status: DC

## 2021-05-15 MED ORDER — ONDANSETRON HCL 4 MG PO TABS: 4.0000 mg | ORAL_TABLET | Freq: Three times a day (TID) | ORAL | 0 refills | Status: DC | PRN

## 2021-05-15 NOTE — Discharge Instructions (Signed)
Take the entire course of the antibiotics prescribed.  You may take the pain medication you were given, 1 tablet every 6 hours if needed for pain, this medicine will make you drowsy so do not drive within 4 hours of taking it.  You have also been prescribed some Zofran which is a nausea medicine if the symptoms persist.  Call your dentist for further evaluation of this dental concern which I suspect is the source of today's sinus infection. ?

## 2021-05-15 NOTE — ED Provider Notes (Signed)
?[place] ?Provider Note ? ? ?CSN: [id number] ?Arrival date & time: 05/15/21  1837 ? ?  ? ?History ? ?Chief Complaint  ?Patient presents with  ? Facial Pain  ? ? ?Kimberly Marks is a 51 y.o. female with history including type 2 diabetes, hypertension presenting for evaluation of facial pain and thick colorful nasal discharge with suspicion for acute sinusitis.  She had a dental extraction approximately 1 month ago on her right upper premolar region and reports that the gingiva at the site has never completely healed.  She has discovered that there appears to be communication between this dental socket and the right sinus as she can "blow air" from her mouth into her sinus region.  She has not notified her dentist yet of this complication.  However over the past several days she has discovered thick nasal discharge which was initially yellow, has turned green and has had bloody episodes with nose blowing as well.  She has significant pain and pressure at her right cheek.  She has taken ibuprofen with no significant relief of pain symptoms.  She denies ear pain, dizziness, headache neck pain or stiffness.  She has had no documented fevers but does endorse a few episodes of chills.  She has found no alleviators for symptoms. ? ?The history is provided by the patient.  ? ?  ? ?Home Medications ?Prior to Admission medications   ?Medication Sig Start Date End Date Taking? Authorizing Provider  ?amoxicillin-clavulanate (AUGMENTIN) 875-125 MG tablet Take 1 tablet by mouth every 12 (twelve) hours. 05/15/21  Yes Maveryck Bahri, Almyra Free, PA-C  ?HYDROcodone-acetaminophen (NORCO/VICODIN) 5-325 MG tablet Take 1 tablet by mouth every 6 (six) hours as needed for severe pain. 05/15/21  Yes Alessandria Henken, Almyra Free, PA-C  ?ondansetron (ZOFRAN) 4 MG tablet Take 1 tablet (4 mg total) by mouth every 8 (eight) hours as needed for nausea or vomiting. 05/15/21  Yes Evalee Jefferson, PA-C  ?ALPRAZolam (XANAX) 0.5 MG tablet Take 0.5 mg by mouth 3  (three) times daily as needed for anxiety.     [provider]  ?bisoprolol-hydrochlorothiazide (ZIAC) 2.5-6.25 MG tablet Take 1 tablet by mouth at bedtime. 02/21/17   Barton Dubois, MD  ?gabapentin (NEURONTIN) 300 MG capsule Take 1 capsule (300 mg total) by mouth 2 (two) times daily. ?Patient taking differently: Take 300 mg by mouth at bedtime. 02/21/17   Barton Dubois, MD  ?glimepiride (AMARYL) 2 MG tablet Take 1 tablet (2 mg total) by mouth daily with breakfast. 02/21/17   Barton Dubois, MD  ?ibuprofen (ADVIL) 200 MG tablet Take 600 mg by mouth every 6 (six) hours as needed for mild pain or moderate pain (for dental pain).     [provider]  ?Ipratropium-Albuterol (COMBIVENT RESPIMAT) 20-100 MCG/ACT AERS respimat Inhale 1 puff into the lungs every 6 (six) hours as needed for wheezing or shortness of breath. 02/21/17   Barton Dubois, MD  ?lidocaine (LIDODERM) 5 % Place 1 patch onto the skin daily. Remove & Discard patch within 12 hours or as directed by MD 10/25/18   Arlean Hopping C, PA-C  ?lidocaine (XYLOCAINE) 2 % solution Use as directed 10 mLs in the mouth or throat every 3 (three) hours as needed for mouth pain. 02/27/21   Volney American, PA-C  ?methocarbamol (ROBAXIN) 500 MG tablet Take 1 tablet (500 mg total) by mouth 2 (two) times daily. 10/25/18   Joy, Shawn C, PA-C  ?sertraline (ZOLOFT) 100 MG tablet Take 1 tablet (100 mg total) by mouth  at bedtime. 02/21/17   Barton Dubois, MD  ?   ? ?Allergies    ?Patient has no known allergies.   ? ?Review of Systems   ?Review of Systems  ?Constitutional:  Positive for chills. Negative for fever.  ?HENT:  Positive for congestion, rhinorrhea and sinus pain. Negative for ear pain, sinus pressure, sore throat, trouble swallowing and voice change.   ?Eyes:  Negative for discharge.  ?Respiratory:  Negative for cough, shortness of breath, wheezing and stridor.   ?Cardiovascular:  Negative for chest pain.  ?Gastrointestinal:  Negative for abdominal  pain.  ?Genitourinary: Negative.   ?All other systems reviewed and are negative. ? ?Physical Exam ?Updated Vital Signs ?BP (!) 132/91 (BP Location: Right Arm)   Pulse 88   Temp 97.8 ?F (36.6 ?C) (Oral)   Resp (!) 22   Ht '5\' 8"'$  (1.727 m)   Wt 106.6 kg   SpO2 96%   BMI 35.73 kg/m?  ?Physical Exam ?Constitutional:   ?   Appearance: She is well-developed.  ?HENT:  ?   Head: Normocephalic and atraumatic.  ?   Right Ear: Tympanic membrane and ear canal normal.  ?   Left Ear: Tympanic membrane and ear canal normal.  ?   Nose: Mucosal edema and congestion present.  ?   Right Sinus: Maxillary sinus tenderness present.  ?   Mouth/Throat:  ?   Mouth: Mucous membranes are moist.  ?   Pharynx: Oropharynx is clear. Uvula midline. No oropharyngeal exudate or posterior oropharyngeal erythema.  ?   Tonsils: No tonsillar abscesses.  ?   Comments: There is a disruption of her gingiva right upper premolar region.  There is no obvious visible fistula, there is no drainage from the site. ?Eyes:  ?   Conjunctiva/sclera: Conjunctivae normal.  ?Cardiovascular:  ?   Rate and Rhythm: Normal rate.  ?   Heart sounds: Normal heart sounds.  ?Pulmonary:  ?   Effort: Pulmonary effort is normal. No respiratory distress.  ?   Breath sounds: No wheezing or rales.  ?Musculoskeletal:     ?   General: Normal range of motion.  ?Lymphadenopathy:  ?   Comments: No head or neck adenopathy.  ?Skin: ?   General: Skin is warm and dry.  ?   Findings: No rash.  ?Neurological:  ?   Mental Status: She is alert and oriented to person, place, and time.  ? ? ?ED Results / Procedures / Treatments   ?Labs ?(all labs ordered are listed, but only abnormal results are displayed) ?Labs Reviewed - No data to display ? ?EKG ?None ? ?Radiology ?No results found. ? ?Procedures ?Procedures  ? ? ?Medications Ordered in ED ?Medications  ?amoxicillin-clavulanate (AUGMENTIN) 875-125 MG per tablet 1 tablet (1 tablet Oral Given 05/15/21 2150)  ?ondansetron (ZOFRAN-ODT)  disintegrating tablet 4 mg (4 mg Oral Given 05/15/21 2151)  ? ? ?ED Course/ Medical Decision Making/ A&P ?  ?                        ?Medical Decision Making ?Patient with history and exam suggesting acute right maxillary sinusitis.  Complicating factor of suspected communication between her dental extraction and the sinus.  She has not contacted her dentist regarding this problem as she just recognized this finding.  She was encouraged to call her dentist on Monday to arrange follow-up care.  In the interim she was placed on antibiotics, she was also prescribed pain medication, she also reports  she has had some nausea secondary to the pain, Zofran was also prescribed.  Vital signs were reviewed, she is afebrile here. ? ?Risk ?Prescription drug management. ? ? ? ? ? ? ? ? ? ? ?Final Clinical Impression(s) / ED Diagnoses ?Final diagnoses:  ?Acute non-recurrent maxillary sinusitis  ? ? ?Rx / DC Orders ?ED Discharge Orders   ? ?      Ordered  ?  amoxicillin-clavulanate (AUGMENTIN) 875-125 MG tablet  Every 12 hours       ? 05/15/21 2059  ?  ondansetron (ZOFRAN) 4 MG tablet  Every 8 hours PRN       ? 05/15/21 2059  ?  HYDROcodone-acetaminophen (NORCO/VICODIN) 5-325 MG tablet  Every 6 hours PRN       ? 05/15/21 2059  ? ?  ?  ? ?  ? ? ?  ?Evalee Jefferson, PA-C ?05/15/21 2207 ? ?  ?Davonna Belling, MD ?05/15/21 2338 ? ?

## 2021-05-15 NOTE — ED Triage Notes (Signed)
Pt with nasal congestion-yellow/green in color, started 4 days ago. Facial pain.  Today with nausea.  ?

## 2021-07-02 ENCOUNTER — Ambulatory Visit
Admission: EM | Admit: 2021-07-02 | Discharge: 2021-07-02 | Disposition: A | Discharge status: Home / Self Care | Payer: BC Managed Care – PPO | Attending: Family Medicine | Admitting: Family Medicine

## 2021-07-02 DIAGNOSIS — R051 Acute cough: Secondary | ICD-10-CM | POA: Diagnosis not present

## 2021-07-02 DIAGNOSIS — R599 Enlarged lymph nodes, unspecified: Secondary | ICD-10-CM

## 2021-07-02 DIAGNOSIS — K1379 Other lesions of oral mucosa: Secondary | ICD-10-CM

## 2021-07-02 MED ORDER — PROMETHAZINE-DM 6.25-15 MG/5ML PO SYRP: 5.0000 mL | ORAL_SOLUTION | Freq: Four times a day (QID) | ORAL | 0 refills | Status: DC | PRN

## 2021-07-02 MED ORDER — AMOXICILLIN-POT CLAVULANATE 875-125 MG PO TABS: 1.0000 | ORAL_TABLET | Freq: Two times a day (BID) | ORAL | 0 refills | Status: DC

## 2021-07-02 NOTE — ED Provider Notes (Signed)
[facility]    CSN: [id number] Arrival date & time: 07/02/21  0800      History   Chief Complaint Chief Complaint  Patient presents with   Sore Throat    HPI Kimberly Marks is a 51 y.o. female.   Presenting today with 4-day history of right-sided sore throat, pain around area of recent dental surgery, adenopathy to the side of neck that seems to be worsening over the the course of time.  States she recently had a tooth removed on the right side and has a surgery next week to repair the hole, wondering if this is causing an infection.  She is in so far not tried anything over-the-counter for symptoms.  Denies difficulties swallowing or breathing, fever, chills, body aches, sweats.    Past Medical History:  Diagnosis Date   Arthritis    Diabetes mellitus without complication ([place])    Hypertension    Neuropathy    Obesity    Sleep apnea     Patient Active Problem List   Diagnosis Date Noted   Obesity, diabetes, and hypertension syndrome ([place])    Hypomagnesemia 02/20/2017   Abnormal EKG 02/20/2017   Hypophosphatemia 02/20/2017   CAP (community acquired pneumonia) 02/19/2017   Hypertension 02/19/2017   Diabetes mellitus without complication ([place]) 94/85/4627   Sleep apnea 02/19/2017   Neuropathy 02/19/2017   Prediabetes 01/29/2014   Obesity 01/29/2014   BACK PAIN 05/20/2008    Past Surgical History:  Procedure Laterality Date   ABDOMINAL HYSTERECTOMY     ABDOMINAL SURGERY     CARPAL TUNNEL RELEASE Bilateral    LITHOTRIPSY     TONSILLECTOMY      OB History     Gravida  1   Para  1   Term  1   Preterm      AB      Living  1      SAB      IAB      Ectopic      Multiple      Live Births               Home Medications    Prior to Admission medications   Medication Sig Start Date End Date Taking? Authorizing Provider  amoxicillin-clavulanate (AUGMENTIN) 875-125 MG tablet Take 1 tablet by mouth every 12 (twelve) hours. 07/02/21   Yes Volney American, PA-C  promethazine-dextromethorphan (PROMETHAZINE-DM) 6.25-15 MG/5ML syrup Take 5 mLs by mouth 4 (four) times daily as needed. 07/02/21  Yes Volney American, PA-C  ALPRAZolam Duanne Moron) 0.5 MG tablet Take 0.5 mg by mouth 3 (three) times daily as needed for anxiety.     [provider]  amoxicillin-clavulanate (AUGMENTIN) 875-125 MG tablet Take 1 tablet by mouth every 12 (twelve) hours. 05/15/21   Evalee Jefferson, PA-C  bisoprolol-hydrochlorothiazide (ZIAC) 2.5-6.25 MG tablet Take 1 tablet by mouth at bedtime. 02/21/17   Barton Dubois, MD  gabapentin (NEURONTIN) 300 MG capsule Take 1 capsule (300 mg total) by mouth 2 (two) times daily. Patient taking differently: Take 300 mg by mouth at bedtime. 02/21/17   Barton Dubois, MD  glimepiride (AMARYL) 2 MG tablet Take 1 tablet (2 mg total) by mouth daily with breakfast. 02/21/17   Barton Dubois, MD  HYDROcodone-acetaminophen (NORCO/VICODIN) 5-325 MG tablet Take 1 tablet by mouth every 6 (six) hours as needed for severe pain. 05/15/21   Evalee Jefferson, PA-C  ibuprofen (ADVIL) 200 MG tablet Take 600 mg by mouth every 6 (six)  hours as needed for mild pain or moderate pain (for dental pain).     [provider]  Ipratropium-Albuterol (COMBIVENT RESPIMAT) 20-100 MCG/ACT AERS respimat Inhale 1 puff into the lungs every 6 (six) hours as needed for wheezing or shortness of breath. 02/21/17   Barton Dubois, MD  lidocaine (LIDODERM) 5 % Place 1 patch onto the skin daily. Remove & Discard patch within 12 hours or as directed by MD 10/25/18   Joy, Shawn C, PA-C  lidocaine (XYLOCAINE) 2 % solution Use as directed 10 mLs in the mouth or throat every 3 (three) hours as needed for mouth pain. 02/27/21   Volney American, PA-C  methocarbamol (ROBAXIN) 500 MG tablet Take 1 tablet (500 mg total) by mouth 2 (two) times daily. 10/25/18   Joy, Shawn C, PA-C  ondansetron (ZOFRAN) 4 MG tablet Take 1 tablet (4 mg total) by mouth every 8  (eight) hours as needed for nausea or vomiting. 05/15/21   Idol, Almyra Free, PA-C  sertraline (ZOLOFT) 100 MG tablet Take 1 tablet (100 mg total) by mouth at bedtime. 02/21/17   Barton Dubois, MD    Family History Family History  Problem Relation Age of Onset   Stroke Mother    Hypertension Mother    COPD Mother    Hyperlipidemia Father    Diabetes Maternal Grandmother    Heart disease Maternal Grandmother    Lung cancer Paternal Grandmother     Social History Social History   Tobacco Use   Smoking status: Former    Packs/day: 1.00    Types: Cigarettes   Smokeless tobacco: Former    Quit date: 11/24/2012  Vaping Use   Vaping Use: Former  Substance Use Topics   Alcohol use: Yes    Alcohol/week: 0.0 standard drinks of alcohol    Comment: occasionally   Drug use: No     Allergies   Patient has no known allergies.   Review of Systems Review of Systems Per HPI  Physical Exam Triage Vital Signs ED Triage Vitals  Enc Vitals Group     BP 07/02/21 0822 129/82     Pulse Rate 07/02/21 0822 72     Resp 07/02/21 0822 18     Temp 07/02/21 0822 98.3 F (36.8 C)     Temp Source 07/02/21 0822 Oral     SpO2 07/02/21 0822 94 %     Weight --      Height --      Head Circumference --      Peak Flow --      Pain Score 07/02/21 0820 8     Pain Loc --      Pain Edu? --      Excl. in [place]? --    No data found.  Updated Vital Signs BP 129/82 (BP Location: Right Arm)   Pulse 72   Temp 98.3 F (36.8 C) (Oral)   Resp 18   SpO2 94%   Visual Acuity Right Eye Distance:   Left Eye Distance:   Bilateral Distance:    Right Eye Near:   Left Eye Near:    Bilateral Near:     Physical Exam Vitals and nursing note reviewed.  Constitutional:      Appearance: Normal appearance. She is not ill-appearing.  HENT:     Head: Atraumatic.     Nose: Nose normal.     Mouth/Throat:     Mouth: Mucous membranes are moist.     Comments: Mild gingival erythema,  edema near recent dental  procedure, no significant tonsillar erythema or edema Eyes:     Extraocular Movements: Extraocular movements intact.     Conjunctiva/sclera: Conjunctivae normal.  Cardiovascular:     Rate and Rhythm: Normal rate and regular rhythm.     Heart sounds: Normal heart sounds.  Pulmonary:     Effort: Pulmonary effort is normal.     Breath sounds: Normal breath sounds.  Musculoskeletal:        General: Normal range of motion.     Cervical back: Normal range of motion and neck supple.  Lymphadenopathy:     Cervical: Cervical adenopathy present.  Skin:    General: Skin is warm and dry.  Neurological:     Mental Status: She is alert and oriented to person, place, and time.  Psychiatric:        Mood and Affect: Mood normal.        Thought Content: Thought content normal.        Judgment: Judgment normal.      UC Treatments / Results  Labs (all labs ordered are listed, but only abnormal results are displayed) Labs Reviewed - No data to display  EKG   Radiology No results found.  Procedures Procedures (including critical care time)  Medications Ordered in UC Medications - No data to display  Initial Impression / Assessment and Plan / UC Course  I have reviewed the triage vital signs and the nursing notes.  Pertinent labs & imaging results that were available during my care of the patient were reviewed by me and considered in my medical decision making (see chart for details).     Unclear specific etiology of symptoms, however given her risk factors for an oral infection will cover with Augmentin.  She also is complaining of a cough over the past week or so so will provide Phenergan DM for this as well.  Discussed supportive over-the-counter medications and home care.  Final Clinical Impressions(s) / UC Diagnoses   Final diagnoses:  Oral pain  Adenopathy  Acute cough   Discharge Instructions   None    ED Prescriptions     Medication Sig Dispense Auth. Provider    amoxicillin-clavulanate (AUGMENTIN) 875-125 MG tablet Take 1 tablet by mouth every 12 (twelve) hours. 14 tablet Volney American, [place]   promethazine-dextromethorphan (PROMETHAZINE-DM) 6.25-15 MG/5ML syrup Take 5 mLs by mouth 4 (four) times daily as needed. 100 mL Volney American, [place]      PDMP not reviewed this encounter.   Merrie Roof [place], [place] 07/02/21 [phone]

## 2021-07-02 NOTE — ED Triage Notes (Signed)
Pt reports right sided sore throat and swelling x 4 days. Pt reports she had a tooth removed on the right sided of her mouth and has a whole were the tooth was and think is causing a sinus infection. Reports she has a dental surgery next week. Pt has not taken any meds for complaints.

## 2021-08-20 ENCOUNTER — Emergency Department (HOSPITAL_COMMUNITY): Payer: BC Managed Care – PPO

## 2021-08-20 ENCOUNTER — Encounter (HOSPITAL_COMMUNITY): Payer: Self-pay | Admitting: Emergency Medicine

## 2021-08-20 ENCOUNTER — Emergency Department (HOSPITAL_COMMUNITY)
Admission: EM | Admit: 2021-08-20 | Discharge: 2021-08-20 | Disposition: A | Discharge status: Home / Self Care | Payer: BC Managed Care – PPO | Attending: Emergency Medicine | Admitting: Emergency Medicine

## 2021-08-20 DIAGNOSIS — R0789 Other chest pain: Secondary | ICD-10-CM | POA: Diagnosis present

## 2021-08-20 DIAGNOSIS — U071 COVID-19: Secondary | ICD-10-CM | POA: Insufficient documentation

## 2021-08-20 LAB — TROPONIN I (HIGH SENSITIVITY)
Troponin I (High Sensitivity): <2 ng/L (ref <18)
Troponin I (High Sensitivity): <2 ng/L (ref <18)

## 2021-08-20 LAB — CBC WITH DIFFERENTIAL/PLATELET
Abs Immature Granulocytes: 0.05 10*3/uL (ref 0.00–0.07)
Basophils Absolute: 0 10*3/uL (ref 0.0–0.1)
Basophils Relative: 0 %
Eosinophils Absolute: 0.1 10*3/uL (ref 0.0–0.5)
Eosinophils Relative: 1 %
HCT: 47.3 % — ABNORMAL HIGH (ref 36.0–46.0)
Hemoglobin: 16.8 g/dL — ABNORMAL HIGH (ref 12.0–15.0)
Immature Granulocytes: 1 %
Lymphocytes Relative: 21 %
Lymphs Abs: 2.3 10*3/uL (ref 0.7–4.0)
MCH: 30.4 pg (ref 26.0–34.0)
MCHC: 35.5 g/dL (ref 30.0–36.0)
MCV: 85.5 fL (ref 80.0–100.0)
Monocytes Absolute: 0.6 10*3/uL (ref 0.1–1.0)
Monocytes Relative: 5 %
Neutro Abs: 8 10*3/uL — ABNORMAL HIGH (ref 1.7–7.7)
Neutrophils Relative %: 72 %
Platelets: 229 10*3/uL (ref 150–400)
RBC: 5.53 MIL/uL — ABNORMAL HIGH (ref 3.87–5.11)
RDW: 12.6 % (ref 11.5–15.5)
WBC: 11 10*3/uL — ABNORMAL HIGH (ref 4.0–10.5)
nRBC: 0 % (ref 0.0–0.2)

## 2021-08-20 LAB — COMPREHENSIVE METABOLIC PANEL
ALT: 44 U/L (ref 0–44)
AST: 32 U/L (ref 15–41)
Albumin: 4.5 g/dL (ref 3.5–5.0)
Alkaline Phosphatase: 151 U/L — ABNORMAL HIGH (ref 38–126)
Anion gap: 10 (ref 5–15)
BUN: 8 mg/dL (ref 6–20)
CO2: 24 mmol/L (ref 22–32)
Calcium: 9.6 mg/dL (ref 8.9–10.3)
Chloride: 99 mmol/L (ref 98–111)
Creatinine, Ser: 0.52 mg/dL (ref 0.44–1.00)
GFR, Estimated: >60 mL/min (ref >60)
Glucose, Bld: 316 mg/dL — ABNORMAL HIGH (ref 70–99)
Potassium: 3.7 mmol/L (ref 3.5–5.1)
Sodium: 133 mmol/L — ABNORMAL LOW (ref 135–145)
Total Bilirubin: 1 mg/dL (ref 0.3–1.2)
Total Protein: 8.3 g/dL — ABNORMAL HIGH (ref 6.5–8.1)

## 2021-08-20 MED ORDER — MOLNUPIRAVIR EUA 200MG CAPSULE
4.0000 | ORAL_CAPSULE | Freq: Two times a day (BID) | ORAL | 0 refills | Status: AC
Start: 2021-08-20 — End: 2021-08-25

## 2021-08-20 MED ORDER — SODIUM CHLORIDE 0.9 % IV BOLUS
1000.0000 mL | Freq: Once | INTRAVENOUS | Status: AC
  Administered 2021-08-20: 1000 mL via INTRAVENOUS

## 2021-08-20 MED ORDER — ACETAMINOPHEN 325 MG PO TABS
650.0000 mg | ORAL_TABLET | Freq: Once | ORAL | Status: AC
Start: 2021-08-20 — End: 2021-08-20
  Administered 2021-08-20: 650 mg via ORAL
  Filled 2021-08-20: qty 2

## 2021-08-20 NOTE — ED Provider Notes (Signed)
[facility] EMERGENCY DEPARTMENT Provider Note   CSN: [id number] Arrival date & time: 08/20/21  1901     History Chief Complaint  Patient presents with   Covid Positive    Kimberly Marks is a 51 y.o. female patient who presents to the emergency department for further evaluation of nasal congestion, ear pain, chest heaviness, and general malaise.  This all started yesterday.  Patient took an at-home COVID test and it was positive.  Patient came to the emergency department for further evaluation.  She denies any fever, chills.  She did have some nausea and vomiting yesterday and some diarrhea yesterday.  None today.  No urinary complaints.  No abdominal pain.  HPI     Home Medications Prior to Admission medications   Medication Sig Start Date End Date Taking? Authorizing Provider  molnupiravir EUA (LAGEVRIO) 200 mg CAPS capsule Take 4 capsules (800 mg total) by mouth 2 (two) times daily for 5 days. 08/20/21 08/25/21 Yes Dawnell Bryant M, PA-C  ALPRAZolam Duanne Moron) 0.5 MG tablet Take 0.5 mg by mouth 3 (three) times daily as needed for anxiety.     [provider]  amoxicillin-clavulanate (AUGMENTIN) 875-125 MG tablet Take 1 tablet by mouth every 12 (twelve) hours. 05/15/21   Evalee Jefferson, PA-C  amoxicillin-clavulanate (AUGMENTIN) 875-125 MG tablet Take 1 tablet by mouth every 12 (twelve) hours. 07/02/21   Volney American, PA-C  bisoprolol-hydrochlorothiazide [facility]) 2.5-6.25 MG tablet Take 1 tablet by mouth at bedtime. 02/21/17   Barton Dubois, MD  gabapentin (NEURONTIN) 300 MG capsule Take 1 capsule (300 mg total) by mouth 2 (two) times daily. Patient taking differently: Take 300 mg by mouth at bedtime. 02/21/17   Barton Dubois, MD  glimepiride (AMARYL) 2 MG tablet Take 1 tablet (2 mg total) by mouth daily with breakfast. 02/21/17   Barton Dubois, MD  HYDROcodone-acetaminophen (NORCO/VICODIN) 5-325 MG tablet Take 1 tablet by mouth every 6 (six) hours as needed for severe pain.  05/15/21   Evalee Jefferson, PA-C  ibuprofen (ADVIL) 200 MG tablet Take 600 mg by mouth every 6 (six) hours as needed for mild pain or moderate pain (for dental pain).     [provider]  Ipratropium-Albuterol (COMBIVENT RESPIMAT) 20-100 MCG/ACT AERS respimat Inhale 1 puff into the lungs every 6 (six) hours as needed for wheezing or shortness of breath. 02/21/17   Barton Dubois, MD  lidocaine (LIDODERM) 5 % Place 1 patch onto the skin daily. Remove & Discard patch within 12 hours or as directed by MD 10/25/18   Joy, Shawn C, PA-C  lidocaine (XYLOCAINE) 2 % solution Use as directed 10 mLs in the mouth or throat every 3 (three) hours as needed for mouth pain. 02/27/21   Volney American, PA-C  methocarbamol (ROBAXIN) 500 MG tablet Take 1 tablet (500 mg total) by mouth 2 (two) times daily. 10/25/18   Joy, Shawn C, PA-C  ondansetron (ZOFRAN) 4 MG tablet Take 1 tablet (4 mg total) by mouth every 8 (eight) hours as needed for nausea or vomiting. 05/15/21   Idol, Almyra Free, PA-C  promethazine-dextromethorphan (PROMETHAZINE-DM) 6.25-15 MG/5ML syrup Take 5 mLs by mouth 4 (four) times daily as needed. 07/02/21   Volney American, PA-C  sertraline (ZOLOFT) 100 MG tablet Take 1 tablet (100 mg total) by mouth at bedtime. 02/21/17   Barton Dubois, MD      Allergies    Patient has no known allergies.    Review of Systems   Review of Systems  All  other systems reviewed and are negative.   Physical Exam Updated Vital Signs BP 129/83   Pulse 93   Temp 99.2 F (37.3 C) (Oral)   Resp (!) 28   Ht '5\' 8"'$  (1.727 m)   Wt 106.6 kg   SpO2 99%   BMI 35.73 kg/m  Physical Exam Vitals and nursing note reviewed.  Constitutional:      General: She is not in acute distress.    Appearance: Normal appearance.  HENT:     Head: Normocephalic and atraumatic.  Eyes:     General:        Right eye: No discharge.        Left eye: No discharge.  Cardiovascular:     Comments: Regular rate and rhythm.  S1/S2  are distinct without any evidence of murmur, rubs, or gallops.  Radial pulses are 2+ bilaterally.  Dorsalis pedis pulses are 2+ bilaterally.  No evidence of pedal edema. Pulmonary:     Comments: Clear to auscultation bilaterally.  Normal effort.  No respiratory distress.  No evidence of wheezes, rales, or rhonchi heard throughout. Abdominal:     General: Abdomen is flat. Bowel sounds are normal. There is no distension.     Tenderness: There is no abdominal tenderness. There is no guarding or rebound.  Musculoskeletal:        General: Normal range of motion.     Cervical back: Neck supple.  Skin:    General: Skin is warm and dry.     Findings: No rash.  Neurological:     General: No focal deficit present.     Mental Status: She is alert.  Psychiatric:        Mood and Affect: Mood normal.        Behavior: Behavior normal.     ED Results / Procedures / Treatments   Labs (all labs ordered are listed, but only abnormal results are displayed) Labs Reviewed  CBC WITH DIFFERENTIAL/PLATELET - Abnormal; Notable for the following components:      Result Value   WBC 11.0 (*)    RBC 5.53 (*)    Hemoglobin 16.8 (*)    HCT 47.3 (*)    Neutro Abs 8.0 (*)    All other components within normal limits  COMPREHENSIVE METABOLIC PANEL - Abnormal; Notable for the following components:   Sodium 133 (*)    Glucose, Bld 316 (*)    Total Protein 8.3 (*)    Alkaline Phosphatase 151 (*)    All other components within normal limits  TROPONIN I (HIGH SENSITIVITY)  TROPONIN I (HIGH SENSITIVITY)    EKG None  Radiology DG Chest 2 View  Result Date: 08/20/2021 CLINICAL DATA:  Chest pain EXAM: CHEST - 2 VIEW COMPARISON:  12/11/2017 FINDINGS: The heart size and mediastinal contours are within normal limits. Both lungs are clear. The visualized skeletal structures are unremarkable. IMPRESSION: No active cardiopulmonary disease. Electronically Signed   By: Fidela Salisbury M.D.   On: 08/20/2021 20:12     Procedures Procedures    Medications Ordered in ED Medications  sodium chloride 0.9 % bolus 1,000 mL (0 mLs Intravenous Stopped 08/20/21 2207)  acetaminophen (TYLENOL) tablet 650 mg (650 mg Oral Given 08/20/21 2032)    ED Course/ Medical Decision Making/ A&P Clinical Course as of 08/20/21 2320  Sat Aug 20, 2021  2313 CBC with Differential(!) There is evidence of leukocytosis and elevated hemoglobin. [CF]  2315 Comprehensive metabolic panel(!) Mild hyponatremia.  Elevated glucose. [CF]  2315 Troponin I (High Sensitivity) Initial and delta troponin are negative. [CF]  2315 DG Chest 2 View No evidence of pneumonia.  I personally ordered and interpreted this study.  I do agree with the radiologist interpretation. [CF]  2315 On reevaluation, patient states that she feels better after fluids and Tylenol.  She still has a headache but it has eased off.  Her chest pain has completely resolved. [CF]    Clinical Course User Index [CF] Hendricks Limes, PA-C                           Medical Decision Making SHANAIA SIEVERS is a 51 y.o. female patient who presents to the emergency department for further evaluation of chest heaviness, nasal congestion, dry malaise in the setting of positive COVID test at home.  This is likely the cause of her symptoms.  I will get cardiac labs in addition to chest x-ray and EKG.  I will also give her some fluids and Tylenol as she does seem to be creeping up with a low-grade temperature.  I will plan to reassess.  Vital signs are normal at this time though.  Labs are all reassuring.  There is no evidence of pneumonia, ACS, pleural effusion.  Patient is in no respiratory distress at this time.  Vital signs are normal.  On reevaluation, patient is feeling better.  Wishes to go home.  This is reasonable.  Given her comorbidities, I will place her on molnupiravir.  She is safe for discharge at this time.  She will follow-up with her primary care doctor for further  evaluation.  Strict return precautions discussed.  Amount and/or Complexity of Data Reviewed External Data Reviewed: notes. Labs: ordered. Decision-making details documented in ED Course. Radiology: ordered. Decision-making details documented in ED Course.  Risk OTC drugs.   Final Clinical Impression(s) / ED Diagnoses Final diagnoses:  COVID    Rx / DC Orders ED Discharge Orders          Ordered    molnupiravir EUA (LAGEVRIO) 200 mg CAPS capsule  2 times daily        08/20/21 2318              Myna Bright Tabor, PA-C 08/20/21 2320    Milton Ferguson, MD 08/22/21 1646

## 2021-08-20 NOTE — ED Triage Notes (Signed)
Pt c/o congestion, headache, body aches, chest pressure and cough. Pt states she tested positive at home for COVID this afternoon.

## 2021-08-20 NOTE — Discharge Instructions (Signed)
Please take molnupiravir as prescribed.  This will help you with COVID.  Drink plenty of fluids and get plenty of rest.  I would like for you to follow-up with your primary care doctor in 1 week for further evaluation.  Please return to the emergency department for any worsening symptoms you might have.

## 2022-05-08 LAB — LIPID PANEL
LDL Cholesterol: 105
Triglycerides: 292 — AB (ref 40–160)

## 2022-05-08 LAB — MICROALBUMIN / CREATININE URINE RATIO: Microalb Creat Ratio: 17

## 2022-05-11 ENCOUNTER — Other Ambulatory Visit (HOSPITAL_COMMUNITY): Payer: Self-pay | Admitting: Internal Medicine

## 2022-05-11 DIAGNOSIS — R748 Abnormal levels of other serum enzymes: Secondary | ICD-10-CM

## 2022-05-26 ENCOUNTER — Ambulatory Visit (HOSPITAL_COMMUNITY)
Admission: RE | Admit: 2022-05-26 | Discharge: 2022-05-26 | Disposition: A | Discharge status: Home / Self Care | Payer: Medicaid Other | Source: Ambulatory Visit | Attending: Internal Medicine | Admitting: Internal Medicine

## 2022-05-26 DIAGNOSIS — R748 Abnormal levels of other serum enzymes: Secondary | ICD-10-CM | POA: Insufficient documentation

## 2022-07-03 ENCOUNTER — Emergency Department (HOSPITAL_COMMUNITY): Payer: Medicaid Other

## 2022-07-03 ENCOUNTER — Encounter (HOSPITAL_COMMUNITY): Payer: Self-pay | Admitting: Emergency Medicine

## 2022-07-03 ENCOUNTER — Other Ambulatory Visit: Payer: Self-pay

## 2022-07-03 ENCOUNTER — Emergency Department (HOSPITAL_COMMUNITY)
Admission: EM | Admit: 2022-07-03 | Discharge: 2022-07-03 | Disposition: A | Discharge status: Home / Self Care | Payer: Medicaid Other | Attending: Emergency Medicine | Admitting: Emergency Medicine

## 2022-07-03 DIAGNOSIS — M545 Low back pain, unspecified: Secondary | ICD-10-CM | POA: Insufficient documentation

## 2022-07-03 LAB — LIPASE, BLOOD: Lipase: 52 U/L — ABNORMAL HIGH (ref 11–51)

## 2022-07-03 LAB — CBC WITH DIFFERENTIAL/PLATELET
Abs Immature Granulocytes: 0.03 10*3/uL (ref 0.00–0.07)
Basophils Absolute: 0 10*3/uL (ref 0.0–0.1)
Basophils Relative: 0 %
Eosinophils Absolute: 0.1 10*3/uL (ref 0.0–0.5)
Eosinophils Relative: 1 %
HCT: 48 % — ABNORMAL HIGH (ref 36.0–46.0)
Hemoglobin: 17 g/dL — ABNORMAL HIGH (ref 12.0–15.0)
Immature Granulocytes: 0 %
Lymphocytes Relative: 44 %
Lymphs Abs: 4.3 10*3/uL — ABNORMAL HIGH (ref 0.7–4.0)
MCH: 30.5 pg (ref 26.0–34.0)
MCHC: 35.4 g/dL (ref 30.0–36.0)
MCV: 86.2 fL (ref 80.0–100.0)
Monocytes Absolute: 0.5 10*3/uL (ref 0.1–1.0)
Monocytes Relative: 5 %
Neutro Abs: 4.8 10*3/uL (ref 1.7–7.7)
Neutrophils Relative %: 50 %
Platelets: 222 10*3/uL (ref 150–400)
RBC: 5.57 MIL/uL — ABNORMAL HIGH (ref 3.87–5.11)
RDW: 12.7 % (ref 11.5–15.5)
WBC: 9.7 10*3/uL (ref 4.0–10.5)
nRBC: 0 % (ref 0.0–0.2)

## 2022-07-03 LAB — URINALYSIS, ROUTINE W REFLEX MICROSCOPIC
Bacteria, UA: NONE SEEN
Bilirubin Urine: NEGATIVE
Glucose, UA: >=500 mg/dL — AB
Hgb urine dipstick: NEGATIVE
Ketones, ur: NEGATIVE mg/dL
Leukocytes,Ua: NEGATIVE
Nitrite: NEGATIVE
Protein, ur: NEGATIVE mg/dL
Specific Gravity, Urine: 1.028 (ref 1.005–1.030)
pH: 6 (ref 5.0–8.0)

## 2022-07-03 LAB — COMPREHENSIVE METABOLIC PANEL
ALT: 45 U/L — ABNORMAL HIGH (ref 0–44)
AST: 37 U/L (ref 15–41)
Albumin: 4.1 g/dL (ref 3.5–5.0)
Alkaline Phosphatase: 115 U/L (ref 38–126)
Anion gap: 9 (ref 5–15)
BUN: 9 mg/dL (ref 6–20)
CO2: 24 mmol/L (ref 22–32)
Calcium: 9.2 mg/dL (ref 8.9–10.3)
Chloride: 101 mmol/L (ref 98–111)
Creatinine, Ser: 0.52 mg/dL (ref 0.44–1.00)
GFR, Estimated: >60 mL/min (ref >60)
Glucose, Bld: 287 mg/dL — ABNORMAL HIGH (ref 70–99)
Potassium: 3.9 mmol/L (ref 3.5–5.1)
Sodium: 134 mmol/L — ABNORMAL LOW (ref 135–145)
Total Bilirubin: 0.8 mg/dL (ref 0.3–1.2)
Total Protein: 7.7 g/dL (ref 6.5–8.1)

## 2022-07-03 MED ORDER — METHOCARBAMOL 500 MG PO TABS: 1000.0000 mg | ORAL_TABLET | Freq: Every day | ORAL | 0 refills | Status: AC

## 2022-07-03 MED ORDER — KETOROLAC TROMETHAMINE 15 MG/ML IJ SOLN
15.0000 mg | Freq: Once | INTRAMUSCULAR | Status: AC
  Administered 2022-07-03: 15 mg via INTRAVENOUS
  Filled 2022-07-03: qty 1

## 2022-07-03 MED ORDER — ONDANSETRON HCL 4 MG/2ML IJ SOLN
4.0000 mg | Freq: Once | INTRAMUSCULAR | Status: AC
  Administered 2022-07-03: 4 mg via INTRAVENOUS
  Filled 2022-07-03: qty 2

## 2022-07-03 NOTE — ED Provider Notes (Cosign Needed Addendum)
[place] EMERGENCY DEPARTMENT AT [facility] Provider Note   CSN: [id number] Arrival date & time: 07/03/22  1107     History  Chief Complaint  Patient presents with   Flank Pain    Kimberly Marks is a 52 y.o. female with history of nephrolithiasis requiring lithotripsy, presenting with acute onset of right flank pain that wraps around her right side.  The pain started suddenly this morning and has been a constant pain.  She denies any injury to the area, nausea or vomiting, hematuria, dysuria.  Is not taking anything for pain this morning.  Pain is a 7 out of 10 currently.  Denies any chest pain, shortness of breath, abdominal pain, abnormal vaginal discharge, concern for STIs. Denies numbness, tingling, weakness, saddle anesthesia, incontinence to bowel/bladder, fever, chills, IV drug use, dysuria, or hx of cancer. Patient has not had prior back surgeries.     Flank Pain       Home Medications Prior to Admission medications   Medication Sig Start Date End Date Taking? Authorizing Provider  methocarbamol (ROBAXIN) 500 MG tablet Take 2 tablets (1,000 mg total) by mouth at bedtime for 7 days. 07/03/22 07/10/22 Yes Arabella Merles, PA-C  ALPRAZolam Prudy Feeler) 0.5 MG tablet Take 0.5 mg by mouth 3 (three) times daily as needed for anxiety.     [provider]  amoxicillin-clavulanate (AUGMENTIN) 875-125 MG tablet Take 1 tablet by mouth every 12 (twelve) hours. 05/15/21   Burgess Amor, PA-C  amoxicillin-clavulanate (AUGMENTIN) 875-125 MG tablet Take 1 tablet by mouth every 12 (twelve) hours. 07/02/21   Particia Nearing, PA-C  bisoprolol-hydrochlorothiazide [facility]) 2.5-6.25 MG tablet Take 1 tablet by mouth at bedtime. 02/21/17   Vassie Loll, MD  gabapentin (NEURONTIN) 300 MG capsule Take 1 capsule (300 mg total) by mouth 2 (two) times daily. Patient taking differently: Take 300 mg by mouth at bedtime. 02/21/17   Vassie Loll, MD  glimepiride (AMARYL) 2 MG tablet Take 1  tablet (2 mg total) by mouth daily with breakfast. 02/21/17   Vassie Loll, MD  HYDROcodone-acetaminophen (NORCO/VICODIN) 5-325 MG tablet Take 1 tablet by mouth every 6 (six) hours as needed for severe pain. 05/15/21   Burgess Amor, PA-C  ibuprofen (ADVIL) 200 MG tablet Take 600 mg by mouth every 6 (six) hours as needed for mild pain or moderate pain (for dental pain).     [provider]  Ipratropium-Albuterol (COMBIVENT RESPIMAT) 20-100 MCG/ACT AERS respimat Inhale 1 puff into the lungs every 6 (six) hours as needed for wheezing or shortness of breath. 02/21/17   Vassie Loll, MD  lidocaine (LIDODERM) 5 % Place 1 patch onto the skin daily. Remove & Discard patch within 12 hours or as directed by MD 10/25/18   Joy, Shawn C, PA-C  lidocaine (XYLOCAINE) 2 % solution Use as directed 10 mLs in the mouth or throat every 3 (three) hours as needed for mouth pain. 02/27/21   Particia Nearing, PA-C  promethazine-dextromethorphan (PROMETHAZINE-DM) 6.25-15 MG/5ML syrup Take 5 mLs by mouth 4 (four) times daily as needed. 07/02/21   Particia Nearing, PA-C  sertraline (ZOLOFT) 100 MG tablet Take 1 tablet (100 mg total) by mouth at bedtime. 02/21/17   Vassie Loll, MD      Allergies    Patient has no known allergies.    Review of Systems   Review of Systems  Genitourinary:  Positive for flank pain.    Physical Exam Updated Vital Signs BP 116/85 (BP Location: Left Arm)  Pulse 64   Temp 97.6 F (36.4 C) (Oral)   Resp 17   Ht 5\' 8"  (1.727 m)   Wt 93 kg   SpO2 94%   BMI 31.17 kg/m  Physical Exam Vitals and nursing note reviewed.  Constitutional:      General: She is not in acute distress. Cardiovascular:     Rate and Rhythm: Normal rate and regular rhythm.  Pulmonary:     Effort: Pulmonary effort is normal.     Breath sounds: Normal breath sounds.  Abdominal:     General: Abdomen is flat. Bowel sounds are normal. There is no distension.     Palpations: Abdomen is soft.      Tenderness: There is no abdominal tenderness. There is no right CVA tenderness or left CVA tenderness.  Musculoskeletal:     Comments: Mild TTP of the right lower paraspinal muscles No TTP to spinous processes  Full Spinal ROM   Skin:    General: Skin is warm and dry.     Findings: No rash.  Neurological:     General: No focal deficit present.     Mental Status: She is alert.     ED Results / Procedures / Treatments   Labs (all labs ordered are listed, but only abnormal results are displayed) Labs Reviewed  URINALYSIS, ROUTINE W REFLEX MICROSCOPIC - Abnormal; Notable for the following components:      Result Value   Glucose, UA >=500 (*)    All other components within normal limits  COMPREHENSIVE METABOLIC PANEL - Abnormal; Notable for the following components:   Sodium 134 (*)    Glucose, Bld 287 (*)    ALT 45 (*)    All other components within normal limits  CBC WITH DIFFERENTIAL/PLATELET - Abnormal; Notable for the following components:   RBC 5.57 (*)    Hemoglobin 17.0 (*)    HCT 48.0 (*)    Lymphs Abs 4.3 (*)    All other components within normal limits  LIPASE, BLOOD - Abnormal; Notable for the following components:   Lipase 52 (*)    All other components within normal limits    EKG None  Radiology CT Renal Triggs Study  Result Date: 07/03/2022 CLINICAL DATA:  Flank pain on the right since 3 a.m. EXAM: CT ABDOMEN AND PELVIS WITHOUT CONTRAST TECHNIQUE: Multidetector CT imaging of the abdomen and pelvis was performed following the standard protocol without IV contrast. RADIATION DOSE REDUCTION: This exam was performed according to the departmental dose-optimization program which includes automated exposure control, adjustment of the mA and/or kV according to patient size and/or use of iterative reconstruction technique. COMPARISON:  CT 06/01/2018.  Abdominal ultrasound 05/26/2022 FINDINGS: Lower chest: Few scattered punctate calcified nodules identified along bases  likely related to old granulomatous disease. There is a 2-3 mm noncalcified nodule left lower lobe laterally on series 4, image 15, unchanged in retrospect from the prior examination demonstrating long-term stability. No specific imaging follow-up. Coronary artery calcifications are seen. Please correlate for other coronary risk factors. Hepatobiliary: Diffuse fatty liver infiltration. Areas of sparing along the gallbladder fossa margin. Gallbladder is nondilated. Pancreas: Unremarkable. No pancreatic ductal dilatation or surrounding inflammatory changes. Spleen: Normal in size without focal abnormality. Adrenals/Urinary Tract: Adrenal glands are preserved. No abnormal calcification seen within either kidney nor along the course of either ureter. The ureters have normal course and caliber extending down to the bladder. Bladder is contracted. Stomach/Bowel: Large bowel is of normal course and caliber with scattered colonic  stool. Normal retrocecal appendix. Stomach and small bowel are nondilated. Vascular/Lymphatic: Aortic atherosclerosis. No enlarged abdominal or pelvic lymph nodes. Reproductive: Status post hysterectomy. No adnexal masses. Other: No free air or free fluid. Small fat containing umbilical hernia. Musculoskeletal: Curvature of the spine with some degenerative change. IMPRESSION: No obstructing renal Valentine. Fatty liver infiltration. Normal appendix with scattered stool. Electronically Signed   By: Karen Kays M.D.   On: 07/03/2022 12:38    Procedures Procedures    Medications Ordered in ED Medications  ketorolac (TORADOL) 15 MG/ML injection 15 mg (15 mg Intravenous Given 07/03/22 1219)  ondansetron (ZOFRAN) injection 4 mg (4 mg Intravenous Given 07/03/22 1219)    ED Course/ Medical Decision Making/ A&P                             Medical Decision Making Amount and/or Complexity of Data Reviewed Labs: ordered. Radiology: ordered.  Risk Prescription drug management.   52 y.o. female  with pertinent past medical history of nephrolithiasis requiring lithotripsy presents to the ED for concern of right flank pain  Differential diagnosis includes but is not limited to nephrolithiasis, injury to the area, shingles,  PE, musculoskeletal pain. Most concerned for nephrolithiasis due to history, location of pain, and slight right sided tenderness. Patient denies any history of trauma, less concerned for this. No rashes present, less concerned for shingles. No shortness of breath or tachycardia, less concerned for PE at this time.  ED Course:  Patient given toradol and zofran to help with nausea and pain.  CT renal shows no evidence of a Kawahara, no hematuria or signs of UTI on urinalysis. No trauma to the area that she knows of  1:00 PM upon reevaluation patient reports pain is 1/10. Still stable appearing. No concerning findings on workup, will discharge home at this time as this is likely musculoskeletal.   Impression: Right lower back pain  Disposition:  The patient was discharged home with instructions to take ibuprofen and methocarbamol as needed for pain control. She should also engage in light physical activity and may use heat on the area to help with pain. Return precautions given  Lab Tests: I Ordered, and personally interpreted labs.  The pertinent results include:   CBC with no leukocytosis CMP with normal LFTs, bilirubin. Lipase only slightly elevated at 52 Urinalysis with no nitrites, leukocytes, RBCs  Imaging Studies ordered: I ordered imaging studies including CT Renal   I independently visualized the imaging with scope of interpretation limited to determining acute life threatening conditions related to emergency care. Imaging showed no nephrolithiasis I agree with the radiologist interpretation   Cardiac Monitoring: / EKG: The patient was maintained on a cardiac monitor.  I personally viewed and interpreted the cardiac monitored which showed an underlying  rhythm of: NSR   Consultations Obtained: None indiacted  Co morbidities that complicate the patient evaluation  History of nephrolithiasis requiring lithotripsy,   Social Determinants of Health:  Smoker      Medications have been reviewed and updated         Final Clinical Impression(s) / ED Diagnoses Final diagnoses:  Acute right-sided low back pain without sciatica    Rx / DC Orders ED Discharge Orders          Ordered    methocarbamol (ROBAXIN) 500 MG tablet  Daily at bedtime        07/03/22 1324  Arabella Merles, PA-C 07/03/22 1359    Arabella Merles, PA-C 07/03/22 1400    Sloan Leiter, DO 07/06/22 [phone]

## 2022-07-03 NOTE — ED Triage Notes (Signed)
Pt c/o right flank pain radiating to pelvic area since around 3am. Pt denies burning/urgency but says she has noticed some urinary frequency as well as nausea. PMH includes DM2, HTN, HLD. Pain rated 6/10

## 2022-07-03 NOTE — Discharge Instructions (Addendum)
Please engage in light physical activity (like walking) to prevent your back pain from worsening and to prevent stiffness. Refrain from bedrest which can make your pain worse. You may use up to 800mg  ibuprofen every 8 hours as needed for pain. You may use a heating back on your back to help with the pain.  You have been prescribed a muscle relaxer, Robaxin (methocarbamol), to take daily before bed as needed for back pain. This medication may make you drowsy. Do not operate heavy machinery or drink alcohol after taking this medication. Go directly to bed after taking.   Please contact your PCP if your back pain does not start to improve over the next 2 weeks as you may benefit from a PT referral from your PCP.   Return to the ER if you have loss of bowel or bladder control, you develop fever or chills, you become very dizzy, you have shortness of breath, chest pain.

## 2022-08-11 ENCOUNTER — Encounter (HOSPITAL_BASED_OUTPATIENT_CLINIC_OR_DEPARTMENT_OTHER): Payer: Self-pay

## 2022-08-11 DIAGNOSIS — R5383 Other fatigue: Secondary | ICD-10-CM

## 2022-08-22 LAB — HEMOGLOBIN A1C: Hemoglobin A1C: 12.2

## 2022-08-30 ENCOUNTER — Ambulatory Visit: Payer: Medicaid Other | Attending: Internal Medicine | Admitting: Pulmonary Disease

## 2022-08-30 DIAGNOSIS — R0683 Snoring: Secondary | ICD-10-CM

## 2022-08-30 DIAGNOSIS — R5383 Other fatigue: Secondary | ICD-10-CM | POA: Diagnosis not present

## 2022-09-01 NOTE — Procedures (Signed)
    Patient Name: Kimberly Marks, Kimberly Marks Date: 08/30/2022 Gender: Female D.O.B: October 03, 1970 Age (years): 57 Referring Provider: Valinda Party FUSCO Height (inches): 68 Interpreting Physician: Coralyn Helling MD, ABSM Weight (lbs): 205 RPSGT: Alfonso Ellis BMI: 31 MRN: [id number]  CLINICAL INFORMATION Sleep Study Type: HST  Indication for sleep study: snoring.  Epworth Sleepiness Score: 12  SLEEP STUDY TECHNIQUE A multi-channel overnight portable sleep study was performed. The channels recorded were: nasal airflow, thoracic respiratory movement, and oxygen saturation with a pulse oximetry. Snoring was also monitored.  MEDICATIONS Patient self administered medications include: BISOPROLOL, GLIMEPIRIDE.  SLEEP ARCHITECTURE Patient was studied for 549 minutes. The sleep efficiency was 97.3 % and the patient was supine for 0%. The arousal index was 0.0 per hour.  RESPIRATORY PARAMETERS The overall AHI was 4.3 per hour, with a central apnea index of 0 per hour.  The oxygen nadir was 91% during sleep.  CARDIAC DATA Mean heart rate during sleep was 77.2 bpm.  IMPRESSIONS - No significant obstructive sleep apnea occurred during this study (AHI = 4.3/h). - The patient had minimal or no oxygen desaturation during the study (Min O2 = 91%) - Patient snored 3.3% during the sleep.  DIAGNOSIS - Normal study  RECOMMENDATIONS - Avoid alcohol, sedatives and other CNS depressants that may worsen sleep apnea and disrupt normal sleep architecture. - Sleep hygiene should be reviewed to assess factors that may improve sleep quality. - Weight management and regular exercise should be initiated or continued. - Patient may benefit from in-lab study  [Electronically signed] 09/06/2022 09:06 AM  Coralyn Helling MD, ABSM Diplomate, American Board of Sleep Medicine NPI: [id number]

## 2022-09-12 ENCOUNTER — Other Ambulatory Visit: Payer: Self-pay

## 2022-09-12 ENCOUNTER — Emergency Department (HOSPITAL_COMMUNITY)
Admission: EM | Admit: 2022-09-12 | Discharge: 2022-09-13 | Disposition: A | Discharge status: Home / Self Care | Payer: Medicaid Other | Attending: Emergency Medicine | Admitting: Emergency Medicine

## 2022-09-12 DIAGNOSIS — U071 COVID-19: Secondary | ICD-10-CM | POA: Insufficient documentation

## 2022-09-12 DIAGNOSIS — I10 Essential (primary) hypertension: Secondary | ICD-10-CM | POA: Insufficient documentation

## 2022-09-12 DIAGNOSIS — E1140 Type 2 diabetes mellitus with diabetic neuropathy, unspecified: Secondary | ICD-10-CM | POA: Insufficient documentation

## 2022-09-12 DIAGNOSIS — J029 Acute pharyngitis, unspecified: Secondary | ICD-10-CM | POA: Diagnosis present

## 2022-09-12 LAB — SARS CORONAVIRUS 2 BY RT PCR: SARS Coronavirus 2 by RT PCR: POSITIVE — AB

## 2022-09-12 MED ORDER — ACETAMINOPHEN 325 MG PO TABS
650.0000 mg | ORAL_TABLET | Freq: Once | ORAL | Status: AC | PRN
  Administered 2022-09-12: 650 mg via ORAL
  Filled 2022-09-12: qty 2

## 2022-09-12 NOTE — ED Triage Notes (Signed)
Pt took at home COVID +. Pt would like to be retested to make sure it is positive because it was last years test. Pt states body aches, fever, face pain, ear pain, sore throat x 2 days.

## 2022-09-13 MED ORDER — NAPROXEN 500 MG PO TABS: 500.0000 mg | ORAL_TABLET | Freq: Two times a day (BID) | ORAL | 0 refills | Status: DC

## 2022-09-13 MED ORDER — NAPROXEN 250 MG PO TABS
500.0000 mg | ORAL_TABLET | Freq: Once | ORAL | Status: AC
  Administered 2022-09-13: 500 mg via ORAL
  Filled 2022-09-13: qty 2

## 2022-09-13 NOTE — ED Provider Notes (Signed)
AP-EMERGENCY DEPT [facility] Emergency Department Provider Note MRN:  [id number]  Arrival date & time: 09/13/22     Chief Complaint   Generalized Body Aches and Fever   History of Present Illness   Kimberly Marks is a 52 y.o. year-old female with a history of hypertension, diabetes presenting to the ED with chief complaint of bodyaches.  Fever, body aches, headache, sore throat for the past day or 2.  No shortness of breath, no chest pain, no abdominal pain, may be a bit feverish.  Review of Systems  A thorough review of systems was obtained and all systems are negative except as noted in the HPI and PMH.   Patient's Health History    Past Medical History:  Diagnosis Date   Arthritis    Diabetes mellitus without complication (HCC)    Hypertension    Neuropathy    Obesity    Sleep apnea     Past Surgical History:  Procedure Laterality Date   ABDOMINAL HYSTERECTOMY     ABDOMINAL SURGERY     CARPAL TUNNEL RELEASE Bilateral    LITHOTRIPSY     TONSILLECTOMY      Family History  Problem Relation Age of Onset   Stroke Mother    Hypertension Mother    COPD Mother    Hyperlipidemia Father    Diabetes Maternal Grandmother    Heart disease Maternal Grandmother    Lung cancer Paternal Grandmother     Social History   Socioeconomic History   Marital status: Single    Spouse name: Not on file   Number of children: Not on file   Years of education: Not on file   Highest education level: Not on file  Occupational History   Not on file  Tobacco Use   Smoking status: Former    Current packs/day: 1.00    Types: Cigarettes   Smokeless tobacco: Former    Quit date: 11/24/2012  Vaping Use   Vaping status: Former  Substance and Sexual Activity   Alcohol use: Yes    Alcohol/week: 0.0 standard drinks of alcohol    Comment: occasionally   Drug use: No   Sexual activity: Yes    Birth control/protection: Surgical  Other Topics Concern   Not on file  Social  History Narrative   Not on file   Social Determinants of Health   Financial Resource Strain: Not on file  Food Insecurity: Not on file  Transportation Needs: Not on file  Physical Activity: Not on file  Stress: Not on file  Social Connections: Not on file  Intimate Partner Violence: Not on file     Physical Exam   Vitals:   09/13/22 0045 09/13/22 0100  BP: 129/89 129/85  Pulse: 94 94  Resp:    Temp:  97.9 F (36.6 C)  SpO2: 95% 93%    CONSTITUTIONAL: Well-appearing, NAD NEURO/PSYCH:  Alert and oriented x 3, no focal deficits EYES:  eyes equal and reactive ENT/NECK:  no LAD, no JVD CARDIO: Regular rate, well-perfused, normal S1 and S2 PULM:  CTAB no wheezing or rhonchi GI/GU:  non-distended, non-tender MSK/SPINE:  No gross deformities, no edema SKIN:  no rash, atraumatic   *Additional and/or pertinent findings included in MDM below  Diagnostic and Interventional Summary    EKG Interpretation Date/Time:    Ventricular Rate:    PR Interval:    QRS Duration:    QT Interval:    QTC Calculation:   R Axis:  Text Interpretation:         Labs Reviewed  SARS CORONAVIRUS 2 BY RT PCR - Abnormal; Notable for the following components:      Result Value   SARS Coronavirus 2 by RT PCR POSITIVE (*)    All other components within normal limits    No orders to display    Medications  acetaminophen (TYLENOL) tablet 650 mg (650 mg Oral Given 09/12/22 2145)     Procedures  /  Critical Care Procedures  ED Course and Medical Decision Making  Initial Impression and Ddx Well-appearing with normal vital signs, oropharynx largely appears normal, lungs clear, no increased work of breathing.  COVID-positive which explains symptoms.  Doubt any other emergent process at this time.  Past medical/surgical history that increases complexity of ED encounter: Hypertension, diabetes  Interpretation of Diagnostics Laboratory and/or imaging options to aid in the diagnosis/care  of the patient were considered.  After careful history and physical examination, it was determined that there was no indication for diagnostics at this time.  Patient Reassessment and Ultimate Disposition/Management     Discharge  Patient management required discussion with the following services or consulting groups:  None  Complexity of Problems Addressed Acute complicated illness or Injury  Additional Data Reviewed and Analyzed Further history obtained from: None  Additional Factors Impacting ED Encounter Risk Prescriptions  Elmer Sow. Pilar Plate, MD [facility] Emergency Medicine [facility] [email or web address]  Final Clinical Impressions(s) / ED Diagnoses     ICD-10-CM   1. COVID-19  U07.1       ED Discharge Orders          Ordered    naproxen (NAPROSYN) 500 MG tablet  2 times daily        09/13/22 0116             Discharge Instructions Discussed with and Provided to Patient:    Discharge Instructions      You were evaluated in the Emergency Department and after careful evaluation, we did not find any emergent condition requiring admission or further testing in the hospital.  Your exam/testing today is overall reassuring.  You tested positive for COVID-19.  Take the Naprosyn anti-inflammatory twice daily for discomfort, plenty of fluids and rest.  Please return to the Emergency Department if you experience any worsening of your condition.   Thank you for allowing [place] to be a part of your care.      Sabas Sous, MD 09/13/22 [phone]

## 2022-09-13 NOTE — Discharge Instructions (Addendum)
You were evaluated in the Emergency Department and after careful evaluation, we did not find any emergent condition requiring admission or further testing in the hospital.  Your exam/testing today is overall reassuring.  You tested positive for COVID-19.  Take the Naprosyn anti-inflammatory twice daily for discomfort, plenty of fluids and rest.  Please return to the Emergency Department if you experience any worsening of your condition.   Thank you for allowing [place] to be a part of your care.

## 2022-11-06 NOTE — Patient Instructions (Signed)

## 2022-11-07 ENCOUNTER — Ambulatory Visit: Payer: Medicaid Other | Admitting: Nurse Practitioner

## 2022-11-07 ENCOUNTER — Encounter: Payer: Self-pay | Admitting: Nurse Practitioner

## 2022-11-07 VITALS — BP 119/87 | HR 102 | Ht 67.0 in | Wt 203.0 lb

## 2022-11-07 DIAGNOSIS — I10 Essential (primary) hypertension: Secondary | ICD-10-CM

## 2022-11-07 DIAGNOSIS — E782 Mixed hyperlipidemia: Secondary | ICD-10-CM

## 2022-11-07 DIAGNOSIS — Z7984 Long term (current) use of oral hypoglycemic drugs: Secondary | ICD-10-CM

## 2022-11-07 DIAGNOSIS — Z7985 Long-term (current) use of injectable non-insulin antidiabetic drugs: Secondary | ICD-10-CM | POA: Diagnosis not present

## 2022-11-07 DIAGNOSIS — E1165 Type 2 diabetes mellitus with hyperglycemia: Secondary | ICD-10-CM | POA: Diagnosis not present

## 2022-11-07 NOTE — Progress Notes (Signed)
Endocrinology Consult Note       11/07/2022, 11:13 AM   Subjective:    Patient ID: Kimberly Marks, female    DOB: September 14, 1970.  Kimberly Marks is being seen in consultation for management of currently uncontrolled symptomatic diabetes requested by  Assunta Found, MD.   Past Medical History:  Diagnosis Date   Arthritis    Diabetes mellitus without complication (HCC)    Hypertension    Neuropathy    Obesity    Sleep apnea     Past Surgical History:  Procedure Laterality Date   ABDOMINAL HYSTERECTOMY     ABDOMINAL SURGERY     CARPAL TUNNEL RELEASE Bilateral    LITHOTRIPSY     TONSILLECTOMY      Social History   Socioeconomic History   Marital status: Single    Spouse name: Not on file   Number of children: Not on file   Years of education: Not on file   Highest education level: Not on file  Occupational History   Not on file  Tobacco Use   Smoking status: Former    Current packs/day: 1.00    Types: Cigarettes   Smokeless tobacco: Former    Quit date: 11/24/2012  Vaping Use   Vaping status: Former  Substance and Sexual Activity   Alcohol use: Yes    Alcohol/week: 0.0 standard drinks of alcohol    Comment: occasionally   Drug use: No   Sexual activity: Yes    Birth control/protection: Surgical  Other Topics Concern   Not on file  Social History Narrative   Not on file   Social Determinants of Health   Financial Resource Strain: Not on file  Food Insecurity: Not on file  Transportation Needs: Not on file  Physical Activity: Not on file  Stress: Not on file  Social Connections: Not on file    Family History  Problem Relation Age of Onset   Stroke Mother    Hypertension Mother    COPD Mother    Hyperlipidemia Father    Diabetes Maternal Grandmother    Heart disease Maternal Grandmother    Lung cancer Paternal Grandmother     Outpatient Encounter Medications as of 11/07/2022   Medication Sig   bisoprolol-hydrochlorothiazide (ZIAC) 2.5-6.25 MG tablet Take 1 tablet by mouth at bedtime.   gabapentin (NEURONTIN) 300 MG capsule Take 1 capsule (300 mg total) by mouth 2 (two) times daily. (Patient taking differently: Take 300 mg by mouth at bedtime.)   HYDROcodone-acetaminophen (NORCO/VICODIN) 5-325 MG tablet Take 1 tablet by mouth every 6 (six) hours as needed for severe pain.   ibuprofen (ADVIL) 200 MG tablet Take 600 mg by mouth every 6 (six) hours as needed for mild pain or moderate pain (for dental pain).    Ipratropium-Albuterol (COMBIVENT RESPIMAT) 20-100 MCG/ACT AERS respimat Inhale 1 puff into the lungs every 6 (six) hours as needed for wheezing or shortness of breath.   OZEMPIC, 0.25 OR 0.5 MG/DOSE, 2 MG/3ML SOPN Inject 0.5 mg into the skin once a week.   [DISCONTINUED] ALPRAZolam (XANAX) 0.5 MG tablet Take 0.5 mg by mouth 3 (three) times daily as needed for anxiety.    [DISCONTINUED]  amoxicillin-clavulanate (AUGMENTIN) 875-125 MG tablet Take 1 tablet by mouth every 12 (twelve) hours.   [DISCONTINUED] amoxicillin-clavulanate (AUGMENTIN) 875-125 MG tablet Take 1 tablet by mouth every 12 (twelve) hours.   [DISCONTINUED] glimepiride (AMARYL) 2 MG tablet Take 1 tablet (2 mg total) by mouth daily with breakfast.   [DISCONTINUED] lidocaine (LIDODERM) 5 % Place 1 patch onto the skin daily. Remove & Discard patch within 12 hours or as directed by MD   [DISCONTINUED] lidocaine (XYLOCAINE) 2 % solution Use as directed 10 mLs in the mouth or throat every 3 (three) hours as needed for mouth pain.   [DISCONTINUED] naproxen (NAPROSYN) 500 MG tablet Take 1 tablet (500 mg total) by mouth 2 (two) times daily.   [DISCONTINUED] promethazine-dextromethorphan (PROMETHAZINE-DM) 6.25-15 MG/5ML syrup Take 5 mLs by mouth 4 (four) times daily as needed.   [DISCONTINUED] sertraline (ZOLOFT) 100 MG tablet Take 1 tablet (100 mg total) by mouth at bedtime.   No facility-administered encounter  medications on file as of 11/07/2022.    ALLERGIES: No Known Allergies  VACCINATION STATUS: Immunization History  Administered Date(s) Administered   Moderna Sars-Covid-2 Vaccination 03/15/2019, 04/16/2019    Diabetes She presents for her initial diabetic visit. She has type 2 diabetes mellitus. Onset time: diagnosed at approx age of 76. Her disease course has been fluctuating. Hypoglycemia symptoms include nervousness/anxiousness, sweats and tremors. Associated symptoms include blurred vision, polydipsia and polyuria. There are no diabetic complications. Risk factors for coronary artery disease include diabetes mellitus, dyslipidemia, family history, obesity, hypertension and tobacco exposure (quit smokin 10 years ago). Current diabetic treatments: Ozempic only. She is compliant with treatment most of the time. Her weight is decreasing steadily. She is following a generally unhealthy diet. When asked about meal planning, she reported none. She has not had a previous visit with a dietitian. She participates in exercise intermittently. (She presents today for her consultation with no meter or logs to review.  Her most recent A1c was 12.2% on 08/22/22.  She has not been checking glucose routinely recently although she does have a meter at home.  She recently cut out all sodas, drinks mostly SF flavored water (powder packets).  She does not eat on any routine pattern.  She says she snacks mostly all day, doesn't seem like she ever gets full.  She engages in routine physical activity by walking but has plans to join a gym to do more.  She is UTD on eye exam, does not see podiatry routinely.) An ACE inhibitor/angiotensin II receptor blocker is not being taken. She does not see a podiatrist.Eye exam is current.     Review of systems  Constitutional: + Minimally fluctuating body weight, current Body mass index is 31.79 kg/m., no fatigue, no subjective hyperthermia, no subjective hypothermia Eyes: no  blurry vision, no xerophthalmia ENT: no sore throat, no nodules palpated in throat, no dysphagia/odynophagia, no hoarseness Cardiovascular: no chest pain, no shortness of breath, no palpitations, no leg swelling Respiratory: no cough, no shortness of breath Gastrointestinal: no nausea/vomiting/diarrhea Musculoskeletal: no muscle/joint aches Skin: no rashes, no hyperemia Neurological: no tremors, no numbness, no tingling, no dizziness Psychiatric: no depression, no anxiety  Objective:     BP 119/87   Pulse (!) 102   Ht 5\' 7"  (1.702 m)   Wt 203 lb (92.1 kg)   BMI 31.79 kg/m   Wt Readings from Last 3 Encounters:  11/07/22 203 lb (92.1 kg)  09/12/22 205 lb 0.4 oz (93 kg)  07/03/22 205 lb (93 kg)  BP Readings from Last 3 Encounters:  11/07/22 119/87  09/13/22 129/85  07/03/22 116/85     Physical Exam- Limited  Constitutional:  Body mass index is 31.79 kg/m. , not in acute distress, normal state of mind Eyes:  EOMI, no exophthalmos Neck: Supple Cardiovascular: RRR, no murmurs, rubs, or gallops, no edema Respiratory: Adequate breathing efforts, no crackles, rales, rhonchi, or wheezing Musculoskeletal: no gross deformities, strength intact in all four extremities, no gross restriction of joint movements Skin:  no rashes, no hyperemia Neurological: no tremor with outstretched hands   Diabetic Foot Exam - Simple   No data filed      CMP ( most recent) CMP     Component Value Date/Time   NA 134 (L) 07/03/2022 1135   K 3.9 07/03/2022 1135   CL 101 07/03/2022 1135   CO2 24 07/03/2022 1135   GLUCOSE 287 (H) 07/03/2022 1135   BUN 9 07/03/2022 1135   CREATININE 0.52 07/03/2022 1135   CALCIUM 9.2 07/03/2022 1135   PROT 7.7 07/03/2022 1135   ALBUMIN 4.1 07/03/2022 1135   AST 37 07/03/2022 1135   ALT 45 (H) 07/03/2022 1135   ALKPHOS 115 07/03/2022 1135   BILITOT 0.8 07/03/2022 1135   GFRNONAA >60 07/03/2022 1135     Diabetic Labs (most recent): Lab Results   Component Value Date   HGBA1C 12.2 08/22/2022   HGBA1C 8.9 (H) 02/20/2017     Lipid Panel ( most recent) Lipid Panel     Component Value Date/Time   TRIG 292 (A) 05/08/2022 0000   LDLCALC 105 05/08/2022 0000      No results found for: "TSH", "FREET4"         Assessment & Plan:   1) Type 2 diabetes mellitus with hyperglycemia, without long-term current use of insulin (HCC)  She presents today for her consultation with no meter or logs to review.  Her most recent A1c was 12.2% on 08/22/22.  She has not been checking glucose routinely recently although she does have a meter at home.  She recently cut out all sodas, drinks mostly SF flavored water (powder packets).  She does not eat on any routine pattern.  She says she snacks mostly all day, doesn't seem like she ever gets full.  She engages in routine physical activity by walking but has plans to join a gym to do more.  She is UTD on eye exam, does not see podiatry routinely.  - Kimberly Marks has currently uncontrolled symptomatic type 2 DM since 52 years of age, with most recent A1c of 12.2 %.   -Recent labs reviewed.  - I had a long discussion with her about the progressive nature of diabetes and the pathology behind its complications. -her diabetes is not currently complicated but she remains at a high risk for more acute and chronic complications which include CAD, CVA, CKD, retinopathy, and neuropathy. These are all discussed in detail with her.  The following Lifestyle Medicine recommendations according to American College of Lifestyle Medicine [facility]) were discussed and offered to patient and she agrees to start the journey:  A. Whole Foods, Plant-based plate comprising of fruits and vegetables, plant-based proteins, whole-grain carbohydrates was discussed in detail with the patient.   A list for source of those nutrients were also provided to the patient.  Patient will use only water or unsweetened tea for hydration. B.  The  need to stay away from risky substances including alcohol, smoking; obtaining 7 to 9 hours of  restorative sleep, at least 150 minutes of moderate intensity exercise weekly, the importance of healthy social connections,  and stress reduction techniques were discussed. C.  A full color page of  Calorie density of various food groups per pound showing examples of each food groups was provided to the patient.  - I have counseled her on diet and weight management by adopting a carbohydrate restricted/protein rich diet. Patient is encouraged to switch to unprocessed or minimally processed complex starch and increased protein intake (animal or plant source), fruits, and vegetables. -  she is advised to stick to a routine mealtimes to eat 3 meals a day and avoid unnecessary snacks (to snack only to correct hypoglycemia).   - she acknowledges that there is a room for improvement in her food and drink choices. - Suggestion is made for her to avoid simple carbohydrates from her diet including Cakes, Sweet Desserts, Ice Cream, Soda (diet and regular), Sweet Tea, Candies, Chips, Cookies, Store Bought Juices, Alcohol in Excess of 1-2 drinks a day, Artificial Sweeteners, Coffee Creamer, and "Sugar-free" Products. This will help patient to have more stable blood glucose profile and potentially avoid unintended weight gain.  - I have approached her with the following individualized plan to manage her diabetes and patient agrees:    -she is encouraged to start monitoring glucose once daily, before breakfast, to log their readings on the clinic sheets provided, and bring them to review at follow up appointment in 3 months.  - Adjustment parameters are given to her for hypo and hyperglycemia in writing. - she is encouraged to call clinic for blood glucose levels less than 70 or above 300 mg /dl. - she is advised to continue Ozempic 0.5 mg SQ weekly (just recently increased to this dose within the last month),  therapeutically suitable for patient .  -She did not tolerate Metformin in the past due to GI side effects.  She also did not tolerate the Rybelsus, caused similar GI issues.  - Specific targets for  A1c; LDL, HDL, and Triglycerides were discussed with the patient.  2) Blood Pressure /Hypertension:  her blood pressure is controlled to target.   she is advised to continue her current medications including Bisoprolol HCT 2.5/6.25 mg p.o. daily with breakfast.  3) Lipids/Hyperlipidemia:    Review of her recent lipid panel from 05/08/22 showed uncontrolled LDL at 105 and elevated triglycerides of 292 .  she is advised to continue Crestor 10 mg daily at bedtime.  Side effects and precautions discussed with her.  4)  Weight/Diet:  her Body mass index is 31.79 kg/m.  -  clearly complicating her diabetes care.   she is a candidate for weight loss. I discussed with her the fact that loss of 5 - 10% of her  current body weight will have the most impact on her diabetes management.  Exercise, and detailed carbohydrates information provided  -  detailed on discharge instructions.  5) Chronic Care/Health Maintenance: -she is not on ACEI/ARB and is on Statin medications and is encouraged to initiate and continue to follow up with Ophthalmology, Dentist, Podiatrist at least yearly or according to recommendations, and advised to stay away from smoking. I have recommended yearly flu vaccine and pneumonia vaccine at least every 5 years; moderate intensity exercise for up to 150 minutes weekly; and sleep for at least 7 hours a day.  - she is advised to maintain close follow up with Assunta Found, MD for primary care needs, as well as her other  providers for optimal and coordinated care.   - Time spent in this patient care: 60 min, of which > 50% was spent in counseling her about her diabetes and the rest reviewing her blood glucose logs, discussing her hypoglycemia and hyperglycemia episodes, reviewing her current  and previous labs/studies (including abstraction from other facilities) and medications doses and developing a long term treatment plan based on the latest standards of care/guidelines; and documenting her care.    Please refer to Patient Instructions for Blood Glucose Monitoring and Insulin/Medications Dosing Guide" in media tab for additional information. Please also refer to "Patient Self Inventory" in the Media tab for reviewed elements of pertinent patient history.  Kimberly Marks participated in the discussions, expressed understanding, and voiced agreement with the above plans.  All questions were answered to her satisfaction. she is encouraged to contact clinic should she have any questions or concerns prior to her return visit.     Follow up plan: - Return in about 3 months (around 02/07/2023) for Diabetes F/U with A1c in office, No previsit labs, Bring meter and logs.    Ronny Bacon, [facility] [address] [place], [place] [address] Phone: [phone] Fax: [phone]  11/07/2022, 11:13 AM

## 2023-02-13 ENCOUNTER — Ambulatory Visit: Payer: Medicaid Other | Admitting: Nurse Practitioner

## 2023-02-13 DIAGNOSIS — I10 Essential (primary) hypertension: Secondary | ICD-10-CM

## 2023-02-13 DIAGNOSIS — E1165 Type 2 diabetes mellitus with hyperglycemia: Secondary | ICD-10-CM

## 2023-02-13 DIAGNOSIS — E782 Mixed hyperlipidemia: Secondary | ICD-10-CM

## 2023-02-13 DIAGNOSIS — Z7984 Long term (current) use of oral hypoglycemic drugs: Secondary | ICD-10-CM

## 2023-02-13 DIAGNOSIS — Z7985 Long-term (current) use of injectable non-insulin antidiabetic drugs: Secondary | ICD-10-CM

## 2023-02-14 ENCOUNTER — Ambulatory Visit: Payer: Medicaid Other | Admitting: Orthopedic Surgery

## 2023-02-14 ENCOUNTER — Other Ambulatory Visit (INDEPENDENT_AMBULATORY_CARE_PROVIDER_SITE_OTHER): Payer: Self-pay

## 2023-02-14 ENCOUNTER — Encounter: Payer: Self-pay | Admitting: Orthopedic Surgery

## 2023-02-14 VITALS — BP 122/79 | Ht 67.0 in | Wt 206.0 lb

## 2023-02-14 DIAGNOSIS — G8929 Other chronic pain: Secondary | ICD-10-CM

## 2023-02-14 DIAGNOSIS — D72829 Elevated white blood cell count, unspecified: Secondary | ICD-10-CM | POA: Insufficient documentation

## 2023-02-14 DIAGNOSIS — M25511 Pain in right shoulder: Secondary | ICD-10-CM | POA: Diagnosis not present

## 2023-02-14 DIAGNOSIS — F410 Panic disorder [episodic paroxysmal anxiety] without agoraphobia: Secondary | ICD-10-CM | POA: Insufficient documentation

## 2023-02-14 DIAGNOSIS — D499 Neoplasm of unspecified behavior of unspecified site: Secondary | ICD-10-CM

## 2023-02-14 HISTORY — DX: Neoplasm of unspecified behavior of unspecified site: D49.9

## 2023-02-14 NOTE — Progress Notes (Signed)
New Patient Visit  Assessment: Kimberly Marks is a 53 y.o. female with the following: 1. Chronic right shoulder pain  Plan: Kimberly Marks has had pain in the right shoulder for several months.  No specific injury.  Insidious onset.  It has affected her motion.  She has recently started taking Celebrex, which has been effective.  She is a poorly controlled diabetic, and is reluctant to proceed with a steroid injection.  This was discussed in clinic today.  I provided reassurance and told her that typically a steroid injection can cause fluctuations in blood sugar, but usually returns to normal within a few days.  She states her understanding.  She would like to discuss this with her endocrinologist.  I am happy to provide an injection if she continues to have difficulty.  Otherwise continue with Celebrex.  I provided her with shoulder exercises that she can complete at home.  Topical treatments are also effective.  She will return as needed.  Follow-up: Return if symptoms worsen or fail to improve.  Subjective:  Chief Complaint  Patient presents with   Shoulder Pain    R shoulder started off with just an ache at night. Pt states for the past month it has been aching throughout the day and she feels some pulling and popping with certain movements.     History of Present Illness: Kimberly Marks is a 53 y.o. female who has been referred by Assunta Found, MD for evaluation of right shoulder pain.  She is right-hand dominant.  She states she for started to have pain in the right shoulder couple months ago.  It started slowly after laying on her right shoulder.  Otherwise, no injuries.  She has not injured her right shoulder in the past.  She has pain over the anterior shoulder, which continues into the trapezius and the back of the shoulder, and into the right side of her neck.  She denies radiating pains from her neck to her hands.  No numbness or tingling.  She has recently started taking Celebrex,  which has provided some relief of her symptoms.  She states narcotics have not helped with her pain.  No prior injections.   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   Sleep apnea     Past Surgical History:  Procedure Laterality Date   ABDOMINAL HYSTERECTOMY     ABDOMINAL SURGERY     CARPAL TUNNEL RELEASE Bilateral    LITHOTRIPSY     TONSILLECTOMY      Family History  Problem Relation Age of Onset   Stroke Mother    Hypertension Mother    COPD Mother    Hyperlipidemia Father    Diabetes Maternal Grandmother    Heart disease Maternal Grandmother    Lung cancer Paternal Grandmother    Social History   Tobacco Use   Smoking status: Former    Current packs/day: 1.00    Types: Cigarettes   Smokeless tobacco: Former    Quit date: 11/24/2012  Vaping Use   Vaping status: Former  Substance Use Topics   Alcohol use: Yes  Alcohol/week: 0.0 standard drinks of alcohol    Comment: occasionally   Drug use: No    No Known Allergies  No outpatient medications have been marked as taking for the 02/14/23 encounter (Office Visit) with Oliver Barre, MD.    Objective: BP 122/79   Ht 5\' 7"  (1.702 m)   Wt 206 lb (93.4 kg)   BMI 32.26 kg/m   Physical Exam:  General: Alert and oriented. and No acute distress. Gait: Normal gait.  Right shoulder without swelling.  No deformity.  Tenderness to palpation over the anterior shoulder.  She also has tenderness within the trapezius and the medial border of the scapula.  Forward flexion is limited to 140 degrees, with some discomfort.  Internal rotation of the lumbar spine.  External rotation is  similar to the contralateral side.  She demonstrates some weakness with supraspinatus and infraspinatus testing, which could be related to pain.  Pain with empty can testing.  Positive Jobe's.  Positive O'Brien's.  IMAGING: I personally ordered and reviewed the following images  X-ray of the right shoulder were obtained in clinic today.  No acute injuries are noted.  Well-maintained glenohumeral joint space.  No evidence of proximal humeral migration.  Within the humeral head, there appears to be some cystic formation.  Loss of joint space at the [facility] joint.  Small osteophytes are appreciated.  No bony lesions.  Impression: Right shoulder x-rays without acute injury, with loss of AC joint space.   New Medications:  No orders of the defined types were placed in this encounter.     Oliver Barre, MD  02/14/2023 2:53 PM

## 2023-02-14 NOTE — Patient Instructions (Signed)

## 2023-04-26 ENCOUNTER — Encounter: Payer: Self-pay | Admitting: Nurse Practitioner

## 2023-04-26 ENCOUNTER — Ambulatory Visit: Payer: Medicaid Other | Admitting: Nurse Practitioner

## 2023-04-26 VITALS — BP 114/78 | HR 60 | Ht 67.0 in | Wt 208.0 lb

## 2023-04-26 DIAGNOSIS — Z7985 Long-term (current) use of injectable non-insulin antidiabetic drugs: Secondary | ICD-10-CM | POA: Diagnosis not present

## 2023-04-26 DIAGNOSIS — Z7984 Long term (current) use of oral hypoglycemic drugs: Secondary | ICD-10-CM

## 2023-04-26 DIAGNOSIS — E1165 Type 2 diabetes mellitus with hyperglycemia: Secondary | ICD-10-CM

## 2023-04-26 DIAGNOSIS — I10 Essential (primary) hypertension: Secondary | ICD-10-CM

## 2023-04-26 DIAGNOSIS — E782 Mixed hyperlipidemia: Secondary | ICD-10-CM | POA: Diagnosis not present

## 2023-04-26 LAB — POCT UA - MICROALBUMIN
Albumin/Creatinine Ratio, Urine, POC: <30
Creatinine, POC: 300 mg/dL
Microalbumin Ur, POC: 30 mg/L

## 2023-04-26 LAB — POCT GLYCOSYLATED HEMOGLOBIN (HGB A1C): HbA1c, POC (controlled diabetic range): 10.2 % — AB (ref 0.0–7.0)

## 2023-04-26 MED ORDER — SEMAGLUTIDE (1 MG/DOSE) 4 MG/3ML ~~LOC~~ SOPN: 1.0000 mg | PEN_INJECTOR | SUBCUTANEOUS | 1 refills | Status: DC

## 2023-04-26 NOTE — Progress Notes (Signed)
 Endocrinology Follow Up Note       04/26/2023, 3:06 PM   Subjective:    Patient ID: Kimberly Marks, female    DOB: 08/09/1970.  Kimberly Marks is being seen in follow up after being seen in consultation for management of currently uncontrolled symptomatic diabetes requested by  Minus Amel, MD.   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   Sleep apnea     Past Surgical History:  Procedure Laterality Date   ABDOMINAL HYSTERECTOMY     ABDOMINAL SURGERY     CARPAL TUNNEL RELEASE Bilateral    LITHOTRIPSY     TONSILLECTOMY      Social History   Socioeconomic History   Marital status: Single    Spouse name: Not on file   Number of children: Not on file   Years of education: Not on file   Highest education level: Not on file  Occupational History   Not on file  Tobacco Use   Smoking status: Former    Current packs/day: 1.00    Types: Cigarettes   Smokeless tobacco: Former    Quit date: 11/24/2012  Vaping Use   Vaping status: Former  Substance and Sexual Activity   Alcohol use: Yes    Alcohol/week: 0.0 standard drinks of alcohol    Comment: occasionally   Drug use: No   Sexual activity: Yes    Birth control/protection: Surgical  Other Topics Concern   Not on file  Social History Narrative   Not on file   Social Drivers of Health   Financial Resource Strain: Not on file  Food Insecurity: Not on file  Transportation Needs: Not on file  Physical Activity: Not on file  Stress: Not on file  Social Connections: Not on file    Family History  Problem Relation Age of Onset   Stroke Mother    Hypertension Mother    COPD Mother    Hyperlipidemia Father    Diabetes Maternal Grandmother    Heart disease Maternal Grandmother    Lung cancer  Paternal Grandmother     Outpatient Encounter Medications as of 04/26/2023  Medication Sig   celecoxib (CELEBREX) 200 MG capsule Take 200 mg by mouth 2 (two) times daily as needed.   Semaglutide , 1 MG/DOSE, 4 MG/3ML SOPN Inject 1 mg as directed once a week.   bisoprolol -hydrochlorothiazide  (ZIAC ) 2.5-6.25 MG tablet Take 1 tablet by mouth at bedtime.   gabapentin  (NEURONTIN ) 300 MG capsule Take 1 capsule (300 mg total) by mouth 2 (two) times daily. (Patient taking differently: Take 300 mg by mouth at bedtime.)   HYDROcodone -acetaminophen  (NORCO/VICODIN) 5-325 MG tablet Take 1 tablet by mouth every 6 (six) hours as needed for severe pain.   ibuprofen  (ADVIL ) 200 MG tablet Take 600 mg by mouth every 6 (six) hours as needed for mild pain or moderate pain (for dental pain).    Ipratropium-Albuterol  (COMBIVENT RESPIMAT) 20-100 MCG/ACT AERS respimat Inhale 1 puff into the lungs every 6 (six) hours as needed  for wheezing or shortness of breath.   [DISCONTINUED] OZEMPIC , 0.25 OR 0.5 MG/DOSE, 2 MG/3ML SOPN Inject 0.5 mg into the skin once a week.   No facility-administered encounter medications on file as of 04/26/2023.    ALLERGIES: No Known Allergies  VACCINATION STATUS: Immunization History  Administered Date(s) Administered   Moderna Sars-Covid-2 Vaccination 03/15/2019, 04/16/2019    Diabetes She presents for her follow-up diabetic visit. She has type 2 diabetes mellitus. Onset time: diagnosed at approx age of 8. Her disease course has been improving. There are no hypoglycemic associated symptoms. Associated symptoms include blurred vision, polydipsia and polyuria. Symptoms are stable. There are no diabetic complications. Risk factors for coronary artery disease include diabetes mellitus, dyslipidemia, family history, obesity, hypertension and tobacco exposure (quit smokin 10 years ago). Current diabetic treatments: Ozempic  only. She is compliant with treatment most of the time. Her weight is  fluctuating minimally. She is following a generally unhealthy diet. When asked about meal planning, she reported none. She has not had a previous visit with a dietitian. She participates in exercise intermittently. Her overall blood glucose range is >200 mg/dl. (She presents today with no meter or logs to review.  Her POCT A1c today is 10.2%, improving from last visit of 12.2%.  She has tolerated the Ozempic  well without any unpleasant side effects.  She notes she still craves sweets, has struggled with eating breakfast.  ) An ACE inhibitor/angiotensin II receptor blocker is not being taken. She does not see a podiatrist.Eye exam is current.    Review of systems  Constitutional: + Minimally fluctuating body weight,  current Body mass index is 32.58 kg/m. , no fatigue, no subjective hyperthermia, no subjective hypothermia Eyes: no blurry vision, no xerophthalmia ENT: no sore throat, no nodules palpated in throat, no dysphagia/odynophagia, no hoarseness Cardiovascular: no chest pain, no shortness of breath, no palpitations, no leg swelling Respiratory: no cough, no shortness of breath Gastrointestinal: no nausea/vomiting/diarrhea Musculoskeletal: no muscle/joint aches Skin: no rashes, no hyperemia Neurological: no tremors, no numbness, no tingling, no dizziness Psychiatric: no depression, no anxiety  Objective:     BP 114/78   Pulse 60   Ht 5\' 7"  (1.702 m)   Wt 208 lb (94.3 kg)   BMI 32.58 kg/m   Wt Readings from Last 3 Encounters:  04/26/23 208 lb (94.3 kg)  02/14/23 206 lb (93.4 kg)  11/07/22 203 lb (92.1 kg)     BP Readings from Last 3 Encounters:  04/26/23 114/78  02/14/23 122/79  11/07/22 119/87      Physical Exam- Limited  Constitutional:  Body mass index is 32.58 kg/m. , not in acute distress, normal state of mind Eyes:  EOMI, no exophthalmos Musculoskeletal: no gross deformities, strength intact in all four extremities, no gross restriction of joint movements Skin:   no rashes, no hyperemia Neurological: no tremor with outstretched hands   Diabetic Foot Exam - Simple   Simple Foot Form Diabetic Foot exam was performed with the following findings: Yes 04/26/2023  2:32 PM  Visual Inspection No deformities, no ulcerations, no other skin breakdown bilaterally: Yes Sensation Testing Intact to touch and monofilament testing bilaterally: Yes Pulse Check Posterior Tibialis and Dorsalis pulse intact bilaterally: Yes Comments      CMP ( most recent) CMP     Component Value Date/Time   NA 134 (L) 07/03/2022 1135   K 3.9 07/03/2022 1135   CL 101 07/03/2022 1135   CO2 24 07/03/2022 1135   GLUCOSE 287 (H) 07/03/2022  1135   BUN 9 07/03/2022 1135   CREATININE 0.52 07/03/2022 1135   CALCIUM 9.2 07/03/2022 1135   PROT 7.7 07/03/2022 1135   ALBUMIN 4.1 07/03/2022 1135   AST 37 07/03/2022 1135   ALT 45 (H) 07/03/2022 1135   ALKPHOS 115 07/03/2022 1135   BILITOT 0.8 07/03/2022 1135   GFRNONAA >60 07/03/2022 1135     Diabetic Labs (most recent): Lab Results  Component Value Date   HGBA1C 10.2 (A) 04/26/2023   HGBA1C 12.2 08/22/2022   HGBA1C 8.9 (H) 02/20/2017   MICROALBUR 30 04/26/2023     Lipid Panel ( most recent) Lipid Panel     Component Value Date/Time   TRIG 292 (A) 05/08/2022 0000   LDLCALC 105 05/08/2022 0000      No results found for: "TSH", "FREET4"         Assessment & Plan:   1) Type 2 diabetes mellitus with hyperglycemia, without long-term current use of insulin  (HCC)  She presents today with no meter or logs to review.  Her POCT A1c today is 10.2%, improving from last visit of 12.2%.  She has tolerated the Ozempic  well without any unpleasant side effects.  She notes she still craves sweets, has struggled with eating breakfast.    - Kimberly Marks has currently uncontrolled symptomatic type 2 DM since 53 years of age.   -Recent labs reviewed.  - I had a long discussion with her about the progressive nature of  diabetes and the pathology behind its complications. -her diabetes is not currently complicated but she remains at a high risk for more acute and chronic complications which include CAD, CVA, CKD, retinopathy, and neuropathy. These are all discussed in detail with her.  The following Lifestyle Medicine recommendations according to American College of Lifestyle Medicine [facility]) were discussed and offered to patient and she agrees to start the journey:  A. Whole Foods, Plant-based plate comprising of fruits and vegetables, plant-based proteins, whole-grain carbohydrates was discussed in detail with the patient.   A list for source of those nutrients were also provided to the patient.  Patient will use only water or unsweetened tea for hydration. B.  The need to stay away from risky substances including alcohol, smoking; obtaining 7 to 9 hours of restorative sleep, at least 150 minutes of moderate intensity exercise weekly, the importance of healthy social connections,  and stress reduction techniques were discussed. C.  A full color page of  Calorie density of various food groups per pound showing examples of each food groups was provided to the patient.  - Nutritional counseling repeated at each appointment due to patients tendency to fall back in to old habits.  - The patient admits there is a room for improvement in their diet and drink choices. -  Suggestion is made for the patient to avoid simple carbohydrates from their diet including Cakes, Sweet Desserts / Pastries, Ice Cream, Soda (diet and regular), Sweet Tea, Candies, Chips, Cookies, Sweet Pastries, Store Bought Juices, Alcohol in Excess of 1-2 drinks a day, Artificial Sweeteners, Coffee Creamer, and "Sugar-free" Products. This will help patient to have stable blood glucose profile and potentially avoid unintended weight gain.   - I encouraged the patient to switch to unprocessed or minimally processed complex starch and increased protein intake  (animal or plant source), fruits, and vegetables.   - Patient is advised to stick to a routine mealtimes to eat 3 meals a day and avoid unnecessary snacks (to snack only to correct  hypoglycemia).  - I have approached her with the following individualized plan to manage her diabetes and patient agrees:   -Will increase her Ozempic  to 1 mg SQ weekly.  -she is encouraged to start monitoring glucose once daily, before breakfast, 3 times per week and to reach out if glucose is less than 70 or above 300 for 3 tests in a row.  - Adjustment parameters are given to her for hypo and hyperglycemia in writing.  -She did not tolerate Metformin in the past due to GI side effects.  She also did not tolerate the Rybelsus , caused similar GI issues.  - Specific targets for  A1c; LDL, HDL, and Triglycerides were discussed with the patient.  2) Blood Pressure /Hypertension:  her blood pressure is controlled to target.   she is advised to continue her current medications including Bisoprolol  HCT 2.5/6.25 mg p.o. daily with breakfast.  3) Lipids/Hyperlipidemia:    Review of her recent lipid panel from 05/08/22 showed uncontrolled LDL at 105 and elevated triglycerides of 292 .  she is advised to continue Crestor 10 mg daily at bedtime.  Side effects and precautions discussed with her.  Will recheck lipid panel prior to next visit.  4)  Weight/Diet:  her Body mass index is 32.58 kg/m.  -  clearly complicating her diabetes care.   she is a candidate for weight loss. I discussed with her the fact that loss of 5 - 10% of her  current body weight will have the most impact on her diabetes management.  Exercise, and detailed carbohydrates information provided  -  detailed on discharge instructions.  5) Chronic Care/Health Maintenance: -she is not on ACEI/ARB and is on Statin medications and is encouraged to initiate and continue to follow up with Ophthalmology, Dentist, Podiatrist at least yearly or according to  recommendations, and advised to stay away from smoking. I have recommended yearly flu vaccine and pneumonia vaccine at least every 5 years; moderate intensity exercise for up to 150 minutes weekly; and sleep for at least 7 hours a day.  - she is advised to maintain close follow up with Minus Amel, MD for primary care needs, as well as her other providers for optimal and coordinated care.     I spent  43  minutes in the care of the patient today including review of labs from CMP, Lipids, Thyroid Function, Hematology (current and previous including abstractions from other facilities); face-to-face time discussing  her blood glucose readings/logs, discussing hypoglycemia and hyperglycemia episodes and symptoms, medications doses, her options of short and long term treatment based on the latest standards of care / guidelines;  discussion about incorporating lifestyle medicine;  and documenting the encounter. Risk reduction counseling performed per USPSTF guidelines to reduce obesity and cardiovascular risk factors.     Please refer to Patient Instructions for Blood Glucose Monitoring and Insulin /Medications Dosing Guide"  in media tab for additional information. Please  also refer to " Patient Self Inventory" in the Media  tab for reviewed elements of pertinent patient history.  Kimberly Marks participated in the discussions, expressed understanding, and voiced agreement with the above plans.  All questions were answered to her satisfaction. she is encouraged to contact clinic should she have any questions or concerns prior to her return visit.     Follow up plan: - Return in about 3 months (around 07/26/2023) for Diabetes F/U with A1c in office, Previsit labs, Bring meter and logs.   Hulon Magic, FNP-BC [facility]  [address] [place], [place] [address] Phone: [phone] Fax: [phone]  04/26/2023, 3:06 PM

## 2023-07-20 LAB — COMPREHENSIVE METABOLIC PANEL WITH GFR
ALT: 43 IU/L — ABNORMAL HIGH (ref 0–32)
AST: 29 IU/L (ref 0–40)
Albumin: 4.5 g/dL (ref 3.8–4.9)
Alkaline Phosphatase: 122 IU/L — ABNORMAL HIGH (ref 44–121)
BUN/Creatinine Ratio: 13 (ref 9–23)
BUN: 10 mg/dL (ref 6–24)
Bilirubin Total: 0.5 mg/dL (ref 0.0–1.2)
CO2: 20 mmol/L (ref 20–29)
Calcium: 9.8 mg/dL (ref 8.7–10.2)
Chloride: 100 mmol/L (ref 96–106)
Creatinine, Ser: 0.79 mg/dL (ref 0.57–1.00)
Globulin, Total: 2.9 g/dL (ref 1.5–4.5)
Glucose: 231 mg/dL — ABNORMAL HIGH (ref 70–99)
Potassium: 4.5 mmol/L (ref 3.5–5.2)
Sodium: 137 mmol/L (ref 134–144)
Total Protein: 7.4 g/dL (ref 6.0–8.5)
eGFR: 89 mL/min/1.73 (ref >59)

## 2023-07-20 LAB — LIPID PANEL
Chol/HDL Ratio: 4.8 ratio — ABNORMAL HIGH (ref 0.0–4.4)
Cholesterol, Total: 191 mg/dL (ref 100–199)
HDL: 40 mg/dL (ref >39)
LDL Chol Calc (NIH): 108 mg/dL — ABNORMAL HIGH (ref 0–99)
Triglycerides: 251 mg/dL — ABNORMAL HIGH (ref 0–149)
VLDL Cholesterol Cal: 43 mg/dL — ABNORMAL HIGH (ref 5–40)

## 2023-07-20 LAB — VITAMIN D 25 HYDROXY (VIT D DEFICIENCY, FRACTURES): Vit D, 25-Hydroxy: 17.7 ng/mL — ABNORMAL LOW (ref 30.0–100.0)

## 2023-07-20 LAB — TSH: TSH: 1.12 u[IU]/mL (ref 0.450–4.500)

## 2023-07-20 LAB — T4, FREE: Free T4: 1.1 ng/dL (ref 0.82–1.77)

## 2023-07-26 ENCOUNTER — Encounter: Payer: Self-pay | Admitting: Nurse Practitioner

## 2023-07-26 ENCOUNTER — Ambulatory Visit: Admitting: Nurse Practitioner

## 2023-07-26 VITALS — BP 104/86 | HR 107 | Ht 67.0 in | Wt 202.0 lb

## 2023-07-26 DIAGNOSIS — E782 Mixed hyperlipidemia: Secondary | ICD-10-CM

## 2023-07-26 DIAGNOSIS — I10 Essential (primary) hypertension: Secondary | ICD-10-CM

## 2023-07-26 DIAGNOSIS — Z7985 Long-term (current) use of injectable non-insulin antidiabetic drugs: Secondary | ICD-10-CM

## 2023-07-26 DIAGNOSIS — E1165 Type 2 diabetes mellitus with hyperglycemia: Secondary | ICD-10-CM | POA: Diagnosis not present

## 2023-07-26 DIAGNOSIS — Z7984 Long term (current) use of oral hypoglycemic drugs: Secondary | ICD-10-CM

## 2023-07-26 LAB — POCT GLYCOSYLATED HEMOGLOBIN (HGB A1C)

## 2023-07-26 MED ORDER — VITAMIN D (ERGOCALCIFEROL) 1.25 MG (50000 UNIT) PO CAPS: 50000.0000 [IU] | ORAL_CAPSULE | ORAL | 1 refills | Status: DC

## 2023-07-26 MED ORDER — SEMAGLUTIDE (2 MG/DOSE) 8 MG/3ML ~~LOC~~ SOPN
2.0000 mg | PEN_INJECTOR | SUBCUTANEOUS | 1 refills | Status: DC
Start: 2023-07-26 — End: 2023-10-31

## 2023-07-26 MED ORDER — GLIPIZIDE ER 5 MG PO TB24: 5.0000 mg | ORAL_TABLET | Freq: Every day | ORAL | 1 refills | Status: DC

## 2023-07-26 NOTE — Progress Notes (Signed)
 Endocrinology Follow Up Note       07/26/2023, 3:14 PM   Subjective:    Patient ID: Kimberly Marks, female    DOB: 07-03-1970.  Kimberly Marks is being seen in follow up after being seen in consultation for management of currently uncontrolled symptomatic diabetes requested by  Marvine Rush, MD.   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   Sleep apnea     Past Surgical History:  Procedure Laterality Date   ABDOMINAL HYSTERECTOMY     ABDOMINAL SURGERY     CARPAL TUNNEL RELEASE Bilateral    LITHOTRIPSY     TONSILLECTOMY      Social History   Socioeconomic History   Marital status: Single    Spouse name: Not on file   Number of children: Not on file   Years of education: Not on file   Highest education level: Not on file  Occupational History   Not on file  Tobacco Use   Smoking status: Former    Current packs/day: 1.00    Types: Cigarettes   Smokeless tobacco: Former    Quit date: 11/24/2012  Vaping Use   Vaping status: Former  Substance and Sexual Activity   Alcohol use: Yes    Alcohol/week: 0.0 standard drinks of alcohol    Comment: occasionally   Drug use: No   Sexual activity: Yes    Birth control/protection: Surgical  Other Topics Concern   Not on file  Social History Narrative   Not on file   Social Drivers of Health   Financial Resource Strain: Not on file  Food Insecurity: Not on file  Transportation Needs: Not on file  Physical Activity: Not on file  Stress: Not on file  Social Connections: Not on file    Family History  Problem Relation Age of Onset   Stroke Mother    Hypertension Mother    COPD Mother    Hyperlipidemia Father    Diabetes Maternal Grandmother    Heart disease Maternal Grandmother    Lung cancer  Paternal Grandmother     Outpatient Encounter Medications as of 07/26/2023  Medication Sig   bisoprolol -hydrochlorothiazide  (ZIAC ) 2.5-6.25 MG tablet Take 1 tablet by mouth at bedtime.   celecoxib (CELEBREX) 200 MG capsule Take 200 mg by mouth 2 (two) times daily as needed.   gabapentin  (NEURONTIN ) 300 MG capsule Take 1 capsule (300 mg total) by mouth 2 (two) times daily.   glipiZIDE  (GLUCOTROL  XL) 5 MG 24 hr tablet Take 1 tablet (5 mg total) by mouth daily with breakfast.   HYDROcodone -acetaminophen  (NORCO/VICODIN) 5-325 MG tablet Take 1 tablet by mouth every 6 (six) hours as needed for severe pain.   ibuprofen  (ADVIL ) 200 MG tablet Take 600 mg by mouth every 6 (six) hours as needed for mild pain or moderate pain (for dental pain).    Ipratropium-Albuterol  (COMBIVENT RESPIMAT) 20-100 MCG/ACT AERS respimat Inhale 1 puff into the lungs every 6 (six) hours as needed for wheezing or shortness of  breath.   Semaglutide , 2 MG/DOSE, 8 MG/3ML SOPN Inject 2 mg as directed once a week.   Vitamin D , Ergocalciferol , (DRISDOL ) 1.25 MG (50000 UNIT) CAPS capsule Take 1 capsule (50,000 Units total) by mouth every 7 (seven) days.   [DISCONTINUED] Semaglutide , 1 MG/DOSE, 4 MG/3ML SOPN Inject 1 mg as directed once a week.   No facility-administered encounter medications on file as of 07/26/2023.    ALLERGIES: No Known Allergies  VACCINATION STATUS: Immunization History  Administered Date(s) Administered   Moderna Sars-Covid-2 Vaccination 03/15/2019, 04/16/2019    Diabetes She presents for her follow-up diabetic visit. She has type 2 diabetes mellitus. Onset time: diagnosed at approx age of 53. Her disease course has been improving. There are no hypoglycemic associated symptoms. Associated symptoms include blurred vision, polydipsia and polyuria. Symptoms are stable. There are no diabetic complications. Risk factors for coronary artery disease include diabetes mellitus, dyslipidemia, family history, obesity,  hypertension and tobacco exposure (quit smokin 10 years ago). Current diabetic treatments: Ozempic  only. She is compliant with treatment all of the time. Her weight is fluctuating minimally. She is following a generally unhealthy diet. When asked about meal planning, she reported none. She has not had a previous visit with a dietitian. She participates in exercise intermittently. (She presents today with no meter or logs to review, she lost her meter recently.  Her POCT A1c today is 9.3%, improving from last visit of 10.2%.  She has tolerated the Ozempic  well without any unpleasant side effects.  She notes she still craves sweets, but has cut out sodas.) An ACE inhibitor/angiotensin II receptor blocker is not being taken. She does not see a podiatrist.Eye exam is current.    Review of systems  Constitutional: +decreasing body weight,  current Body mass index is 31.64 kg/m. , no fatigue, no subjective hyperthermia, no subjective hypothermia Eyes: no blurry vision, no xerophthalmia ENT: no sore throat, no nodules palpated in throat, no dysphagia/odynophagia, no hoarseness Cardiovascular: no chest pain, no shortness of breath, no palpitations, no leg swelling Respiratory: no cough, no shortness of breath Gastrointestinal: no nausea/vomiting/diarrhea Musculoskeletal: no muscle/joint aches Skin: no rashes, no hyperemia Neurological: no tremors, no numbness, no tingling, no dizziness Psychiatric: no depression, no anxiety  Objective:     BP 104/86 (BP Location: Right Arm, Patient Position: Sitting, Cuff Size: Large)   Pulse (!) 107   Ht 5' 7 (1.702 m)   Wt 202 lb (91.6 kg)   BMI 31.64 kg/m   Wt Readings from Last 3 Encounters:  07/26/23 202 lb (91.6 kg)  04/26/23 208 lb (94.3 kg)  02/14/23 206 lb (93.4 kg)     BP Readings from Last 3 Encounters:  07/26/23 104/86  04/26/23 114/78  02/14/23 122/79      Physical Exam- Limited  Constitutional:  Body mass index is 31.64 kg/m. , not in  acute distress, normal state of mind Eyes:  EOMI, no exophthalmos Musculoskeletal: no gross deformities, strength intact in all four extremities, no gross restriction of joint movements Skin:  no rashes, no hyperemia Neurological: no tremor with outstretched hands   Diabetic Foot Exam - Simple   No data filed      CMP ( most recent) CMP     Component Value Date/Time   NA 137 07/19/2023 1113   K 4.5 07/19/2023 1113   CL 100 07/19/2023 1113   CO2 20 07/19/2023 1113   GLUCOSE 231 (H) 07/19/2023 1113   GLUCOSE 287 (H) 07/03/2022 1135   BUN 10 07/19/2023  1113   CREATININE 0.79 07/19/2023 1113   CALCIUM 9.8 07/19/2023 1113   PROT 7.4 07/19/2023 1113   ALBUMIN 4.5 07/19/2023 1113   AST 29 07/19/2023 1113   ALT 43 (H) 07/19/2023 1113   ALKPHOS 122 (H) 07/19/2023 1113   BILITOT 0.5 07/19/2023 1113   EGFR 89 07/19/2023 1113   GFRNONAA >60 07/03/2022 1135     Diabetic Labs (most recent): Lab Results  Component Value Date   HGBA1C 10.2 (A) 04/26/2023   HGBA1C 12.2 08/22/2022   HGBA1C 8.9 (H) 02/20/2017   MICROALBUR 30 04/26/2023     Lipid Panel ( most recent) Lipid Panel     Component Value Date/Time   CHOL 191 07/19/2023 1113   TRIG 251 (H) 07/19/2023 1113   HDL 40 07/19/2023 1113   CHOLHDL 4.8 (H) 07/19/2023 1113   LDLCALC 108 (H) 07/19/2023 1113   LABVLDL 43 (H) 07/19/2023 1113      Lab Results  Component Value Date   TSH 1.120 07/19/2023   FREET4 1.10 07/19/2023           Assessment & Plan:   1) Type 2 diabetes mellitus with hyperglycemia, without long-term current use of insulin  (HCC)  She presents today with no meter or logs to review, she lost her meter recently.  Her POCT A1c today is 9.3%, improving from last visit of 10.2%.  She has tolerated the Ozempic  well without any unpleasant side effects.  She notes she still craves sweets, but has cut out sodas.    - Kimberly Marks has currently uncontrolled symptomatic type 2 DM since 53 years of age.    -Recent labs reviewed.  - I had a long discussion with her about the progressive nature of diabetes and the pathology behind its complications. -her diabetes is not currently complicated but she remains at a high risk for more acute and chronic complications which include CAD, CVA, CKD, retinopathy, and neuropathy. These are all discussed in detail with her.  The following Lifestyle Medicine recommendations according to American College of Lifestyle Medicine [facility]) were discussed and offered to patient and she agrees to start the journey:  A. Whole Foods, Plant-based plate comprising of fruits and vegetables, plant-based proteins, whole-grain carbohydrates was discussed in detail with the patient.   A list for source of those nutrients were also provided to the patient.  Patient will use only water or unsweetened tea for hydration. B.  The need to stay away from risky substances including alcohol, smoking; obtaining 7 to 9 hours of restorative sleep, at least 150 minutes of moderate intensity exercise weekly, the importance of healthy social connections,  and stress reduction techniques were discussed. C.  A full color page of  Calorie density of various food groups per pound showing examples of each food groups was provided to the patient.  - Nutritional counseling repeated at each appointment due to patients tendency to fall back in to old habits.  - The patient admits there is a room for improvement in their diet and drink choices. -  Suggestion is made for the patient to avoid simple carbohydrates from their diet including Cakes, Sweet Desserts / Pastries, Ice Cream, Soda (diet and regular), Sweet Tea, Candies, Chips, Cookies, Sweet Pastries, Store Bought Juices, Alcohol in Excess of 1-2 drinks a day, Artificial Sweeteners, Coffee Creamer, and Sugar-free Products. This will help patient to have stable blood glucose profile and potentially avoid unintended weight gain.   - I encouraged the  patient to switch  to unprocessed or minimally processed complex starch and increased protein intake (animal or plant source), fruits, and vegetables.   - Patient is advised to stick to a routine mealtimes to eat 3 meals a day and avoid unnecessary snacks (to snack only to correct hypoglycemia).  - I have approached her with the following individualized plan to manage her diabetes and patient agrees:   -Will increase her Ozempic  to 2 mg SQ weekly.  Will also add her on Glipizide  5 mg XL daily with breakfast to help bring glucose down a bit quicker.  She is advised to let me know if she experiences any hypoglycemia.  -she is encouraged to start monitoring glucose once daily, before breakfast, and to reach out if glucose is less than 70 or above 300 for 3 tests in a row.  - Adjustment parameters are given to her for hypo and hyperglycemia in writing.  -She did not tolerate Metformin in the past due to GI side effects.  She also did not tolerate the Rybelsus , caused similar GI issues.  - Specific targets for  A1c; LDL, HDL, and Triglycerides were discussed with the patient.  2) Blood Pressure /Hypertension:  her blood pressure is controlled to target.   she is advised to continue her current medications as prescribed by PCP.  3) Lipids/Hyperlipidemia:    Review of her recent lipid panel from 07/19/23 showed uncontrolled LDL at 108 and elevated triglycerides of 251 (slowly improving).  she is advised to continue Crestor 10 mg daily at bedtime.  Side effects and precautions discussed with her.  She does have elevated LFTs too, was told in the past she had fatty liver.  She is encouraged to cut out fried, fatty foods and red meats.  4)  Weight/Diet:  her Body mass index is 31.64 kg/m.  -  clearly complicating her diabetes care.   she is a candidate for weight loss. I discussed with her the fact that loss of 5 - 10% of her  current body weight will have the most impact on her diabetes management.   Exercise, and detailed carbohydrates information provided  -  detailed on discharge instructions.  5) Chronic Care/Health Maintenance: -she is not on ACEI/ARB and is on Statin medications and is encouraged to initiate and continue to follow up with Ophthalmology, Dentist, Podiatrist at least yearly or according to recommendations, and advised to stay away from smoking. I have recommended yearly flu vaccine and pneumonia vaccine at least every 5 years; moderate intensity exercise for up to 150 minutes weekly; and sleep for at least 7 hours a day.  6) Vitamin d  deficiency Her recent vitamin D  level from 07/19/23 was 17.7.  She is not on any supplementation.  I discussed and initiated Ergocalciferol  50000 units po weekly x 6 months, at which time we can likely change over to OTC treatment.  - she is advised to maintain close follow up with Marvine Rush, MD for primary care needs, as well as her other providers for optimal and coordinated care.     I spent  36  minutes in the care of the patient today including review of labs from CMP, Lipids, Thyroid Function, Hematology (current and previous including abstractions from other facilities); face-to-face time discussing  her blood glucose readings/logs, discussing hypoglycemia and hyperglycemia episodes and symptoms, medications doses, her options of short and long term treatment based on the latest standards of care / guidelines;  discussion about incorporating lifestyle medicine;  and documenting the encounter. Risk reduction  counseling performed per USPSTF guidelines to reduce obesity and cardiovascular risk factors.     Please refer to Patient Instructions for Blood Glucose Monitoring and Insulin /Medications Dosing Guide  in media tab for additional information. Please  also refer to  Patient Self Inventory in the Media  tab for reviewed elements of pertinent patient history.  Kimberly Marks participated in the discussions, expressed understanding,  and voiced agreement with the above plans.  All questions were answered to her satisfaction. she is encouraged to contact clinic should she have any questions or concerns prior to her return visit.     Follow up plan: - Return in about 3 months (around 10/26/2023) for Diabetes F/U with A1c in office, No previsit labs, Bring meter and logs.  Benton Rio, [facility] [address] [place], [place] [address] Phone: [phone] Fax: [phone]  07/26/2023, 3:14 PM

## 2023-09-28 ENCOUNTER — Other Ambulatory Visit: Payer: Self-pay | Admitting: *Deleted

## 2023-09-28 DIAGNOSIS — E1165 Type 2 diabetes mellitus with hyperglycemia: Secondary | ICD-10-CM

## 2023-09-28 DIAGNOSIS — Z7984 Long term (current) use of oral hypoglycemic drugs: Secondary | ICD-10-CM

## 2023-09-28 MED ORDER — ACCU-CHEK GUIDE TEST VI STRP: ORAL_STRIP | 5 refills | Status: AC

## 2023-10-31 ENCOUNTER — Encounter: Payer: Self-pay | Admitting: Nurse Practitioner

## 2023-10-31 ENCOUNTER — Ambulatory Visit: Admitting: Nurse Practitioner

## 2023-10-31 VITALS — BP 110/80 | HR 114 | Ht 67.0 in | Wt 202.4 lb

## 2023-10-31 DIAGNOSIS — E782 Mixed hyperlipidemia: Secondary | ICD-10-CM | POA: Diagnosis not present

## 2023-10-31 DIAGNOSIS — E1165 Type 2 diabetes mellitus with hyperglycemia: Secondary | ICD-10-CM | POA: Diagnosis not present

## 2023-10-31 DIAGNOSIS — Z7985 Long-term (current) use of injectable non-insulin antidiabetic drugs: Secondary | ICD-10-CM | POA: Diagnosis not present

## 2023-10-31 DIAGNOSIS — Z7984 Long term (current) use of oral hypoglycemic drugs: Secondary | ICD-10-CM

## 2023-10-31 DIAGNOSIS — I10 Essential (primary) hypertension: Secondary | ICD-10-CM

## 2023-10-31 LAB — POCT GLYCOSYLATED HEMOGLOBIN (HGB A1C): Hemoglobin A1C: 8.3 % — AB (ref 4.0–5.6)

## 2023-10-31 MED ORDER — GLIPIZIDE ER 5 MG PO TB24: 5.0000 mg | ORAL_TABLET | Freq: Every day | ORAL | 1 refills | Status: AC

## 2023-10-31 MED ORDER — SEMAGLUTIDE (2 MG/DOSE) 8 MG/3ML ~~LOC~~ SOPN: 2.0000 mg | PEN_INJECTOR | SUBCUTANEOUS | 1 refills | Status: DC

## 2023-10-31 NOTE — Progress Notes (Signed)
 Endocrinology Follow Up Note       10/31/2023, 3:33 PM   Subjective:    Patient ID: Kimberly Marks, female    DOB: [birth year].  Kimberly Marks is being seen in follow up after being seen in consultation for management of currently uncontrolled symptomatic diabetes requested by  Marvine Rush, MD.   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   Sleep apnea     Past Surgical History:  Procedure Laterality Date   ABDOMINAL HYSTERECTOMY     ABDOMINAL SURGERY     CARPAL TUNNEL RELEASE Bilateral    LITHOTRIPSY     TONSILLECTOMY      Social History   Socioeconomic History   Marital status: Single    Spouse name: Not on file   Number of children: Not on file   Years of education: Not on file   Highest education level: Not on file  Occupational History   Not on file  Tobacco Use   Smoking status: Former    Current packs/day: 1.00    Types: Cigarettes   Smokeless tobacco: Former    Quit date: 11/24/2012  Vaping Use   Vaping status: Former  Substance and Sexual Activity   Alcohol use: Yes    Alcohol/week: 0.0 standard drinks of alcohol    Comment: occasionally   Drug use: No   Sexual activity: Yes    Birth control/protection: Surgical  Other Topics Concern   Not on file  Social History Narrative   Not on file   Social Drivers of Health   Financial Resource Strain: Not on file  Food Insecurity: Not on file  Transportation Needs: Not on file  Physical Activity: Not on file  Stress: Not on file  Social Connections: Not on file    Family History  Problem Relation Age of Onset   Stroke Mother    Hypertension Mother    COPD Mother    Hyperlipidemia Father    Diabetes Maternal Grandmother    Heart disease Maternal Grandmother    Lung cancer  Paternal Grandmother     Outpatient Encounter Medications as of 10/31/2023  Medication Sig   bisoprolol -hydrochlorothiazide  (ZIAC ) 2.5-6.25 MG tablet Take 1 tablet by mouth at bedtime.   celecoxib (CELEBREX) 200 MG capsule Take 200 mg by mouth 2 (two) times daily as needed.   gabapentin  (NEURONTIN ) 300 MG capsule Take 1 capsule (300 mg total) by mouth 2 (two) times daily. (Patient taking differently: Take 300 mg by mouth 2 (two) times daily. Patient reports that she may use once a day)   glucose blood (ACCU-CHEK GUIDE TEST) test strip Use one (1) test strip to check blood glucose one (1) times daily as directed by provider.   HYDROcodone -acetaminophen  (NORCO/VICODIN) 5-325 MG tablet Take 1 tablet by mouth every 6 (six) hours as needed for severe pain.   ibuprofen  (ADVIL ) 200 MG tablet Take 600 mg by mouth every 6 (six) hours as needed for mild pain or moderate pain (for dental pain).  Ipratropium-Albuterol  (COMBIVENT RESPIMAT) 20-100 MCG/ACT AERS respimat Inhale 1 puff into the lungs every 6 (six) hours as needed for wheezing or shortness of breath.   Vitamin D , Ergocalciferol , (DRISDOL ) 1.25 MG (50000 UNIT) CAPS capsule Take 1 capsule (50,000 Units total) by mouth every 7 (seven) days.   [DISCONTINUED] Semaglutide , 2 MG/DOSE, 8 MG/3ML SOPN Inject 2 mg as directed once a week.   glipiZIDE  (GLUCOTROL  XL) 5 MG 24 hr tablet Take 1 tablet (5 mg total) by mouth daily with breakfast.   Semaglutide , 2 MG/DOSE, 8 MG/3ML SOPN Inject 2 mg as directed once a week.   [DISCONTINUED] glipiZIDE  (GLUCOTROL  XL) 5 MG 24 hr tablet Take 1 tablet (5 mg total) by mouth daily with breakfast. (Patient not taking: Reported on 10/31/2023)   No facility-administered encounter medications on file as of 10/31/2023.    ALLERGIES: No Known Allergies  VACCINATION STATUS: Immunization History  Administered Date(s) Administered   Moderna Sars-Covid-2 Vaccination 03/15/2019, 04/16/2019    Diabetes She presents for her  follow-up diabetic visit. She has type 2 diabetes mellitus. Onset time: diagnosed at approx age of 89. Her disease course has been improving. There are no hypoglycemic associated symptoms. Associated symptoms include blurred vision, polydipsia and polyuria. Symptoms are improving. There are no diabetic complications. Risk factors for coronary artery disease include diabetes mellitus, dyslipidemia, family history, obesity, hypertension and tobacco exposure (quit smokin 10 years ago). Current diabetic treatments: Ozempic  only. She is compliant with treatment all of the time. Her weight is fluctuating minimally. She is following a generally unhealthy diet. When asked about meal planning, she reported none. She has not had a previous visit with a dietitian. She participates in exercise intermittently. Her home blood glucose trend is decreasing steadily. Her breakfast blood glucose range is generally >200 mg/dl. (She presents today with no meter or logs to review, has been monitoring glucose at home but not as frequently.  She has glucose above 200 which is better than the 300s previously.  Her POCT A1c today is 8.3% improving from last visit of 9.3%.  She has tolerated the Ozempic  well without any unpleasant side effects.  She still eats sweets but not as much.  She never started taking the Glipizide  as she does not eat breakfast and the directions said to take with breakfast.) An ACE inhibitor/angiotensin II receptor blocker is not being taken. She does not see a podiatrist.Eye exam is current.    Review of systems  Constitutional: +stable body weight,  current Body mass index is 31.7 kg/m. , no fatigue, no subjective hyperthermia, no subjective hypothermia Eyes: no blurry vision, no xerophthalmia ENT: no sore throat, no nodules palpated in throat, no dysphagia/odynophagia, no hoarseness Cardiovascular: no chest pain, no shortness of breath, no palpitations, no leg swelling Respiratory: no cough, no shortness  of breath Gastrointestinal: no nausea/vomiting/diarrhea Musculoskeletal: no muscle/joint aches Skin: no rashes, no hyperemia Neurological: no tremors, no numbness, no tingling, no dizziness Psychiatric: no depression, no anxiety  Objective:     BP 110/80 (BP Location: Left Arm, Patient Position: Sitting, Cuff Size: Large)   Pulse (!) 114   Ht 5' 7 (1.702 m)   Wt 202 lb 6.4 oz (91.8 kg)   BMI 31.70 kg/m   Wt Readings from Last 3 Encounters:  10/31/23 202 lb 6.4 oz (91.8 kg)  07/26/23 202 lb (91.6 kg)  04/26/23 208 lb (94.3 kg)     BP Readings from Last 3 Encounters:  10/31/23 110/80  07/26/23 104/86  04/26/23 114/78  Physical Exam- Limited  Constitutional:  Body mass index is 31.7 kg/m. , not in acute distress, normal state of mind Eyes:  EOMI, no exophthalmos Musculoskeletal: no gross deformities, strength intact in all four extremities, no gross restriction of joint movements Skin:  no rashes, no hyperemia Neurological: no tremor with outstretched hands   Diabetic Foot Exam - Simple   No data filed      CMP ( most recent) CMP     Component Value Date/Time   NA 137 07/19/2023 1113   K 4.5 07/19/2023 1113   CL 100 07/19/2023 1113   CO2 20 07/19/2023 1113   GLUCOSE 231 (H) 07/19/2023 1113   GLUCOSE 287 (H) 07/03/2022 1135   BUN 10 07/19/2023 1113   CREATININE 0.79 07/19/2023 1113   CALCIUM 9.8 07/19/2023 1113   PROT 7.4 07/19/2023 1113   ALBUMIN 4.5 07/19/2023 1113   AST 29 07/19/2023 1113   ALT 43 (H) 07/19/2023 1113   ALKPHOS 122 (H) 07/19/2023 1113   BILITOT 0.5 07/19/2023 1113   EGFR 89 07/19/2023 1113   GFRNONAA >60 07/03/2022 1135     Diabetic Labs (most recent): Lab Results  Component Value Date   HGBA1C 8.3 (A) 10/31/2023   HGBA1C 10.2 (A) 04/26/2023   HGBA1C 12.2 08/22/2022   MICROALBUR 30 04/26/2023     Lipid Panel ( most recent) Lipid Panel     Component Value Date/Time   CHOL 191 07/19/2023 1113   TRIG 251 (H)  07/19/2023 1113   HDL 40 07/19/2023 1113   CHOLHDL 4.8 (H) 07/19/2023 1113   LDLCALC 108 (H) 07/19/2023 1113   LABVLDL 43 (H) 07/19/2023 1113      Lab Results  Component Value Date   TSH 1.120 07/19/2023   FREET4 1.10 07/19/2023           Assessment & Plan:   1) Type 2 diabetes mellitus with hyperglycemia, without long-term current use of insulin  (HCC)   She presents today with no meter or logs to review, has been monitoring glucose at home but not as frequently.  She has glucose above 200 which is better than the 300s previously.  Her POCT A1c today is 8.3% improving from last visit of 9.3%.  She has tolerated the Ozempic  well without any unpleasant side effects.  She still eats sweets but not as much.  She never started taking the Glipizide  as she does not eat breakfast and the directions said to take with breakfast.  - Kimberly Marks has currently uncontrolled symptomatic type 2 DM since 53 years of age.   -Recent labs reviewed.  - I had a long discussion with her about the progressive nature of diabetes and the pathology behind its complications. -her diabetes is not currently complicated but she remains at a high risk for more acute and chronic complications which include CAD, CVA, CKD, retinopathy, and neuropathy. These are all discussed in detail with her.  The following Lifestyle Medicine recommendations according to American College of Lifestyle Medicine [facility]) were discussed and offered to patient and she agrees to start the journey:  A. Whole Foods, Plant-based plate comprising of fruits and vegetables, plant-based proteins, whole-grain carbohydrates was discussed in detail with the patient.   A list for source of those nutrients were also provided to the patient.  Patient will use only water or unsweetened tea for hydration. B.  The need to stay away from risky substances including alcohol, smoking; obtaining 7 to 9 hours of restorative sleep, at least  150 minutes of  moderate intensity exercise weekly, the importance of healthy social connections,  and stress reduction techniques were discussed. C.  A full color page of  Calorie density of various food groups per pound showing examples of each food groups was provided to the patient.  - Nutritional counseling repeated/built upon at each appointment.  - The patient admits there is a room for improvement in their diet and drink choices. -  Suggestion is made for the patient to avoid simple carbohydrates from their diet including Cakes, Sweet Desserts / Pastries, Ice Cream, Soda (diet and regular), Sweet Tea, Candies, Chips, Cookies, Sweet Pastries, Store Bought Juices, Alcohol in Excess of 1-2 drinks a day, Artificial Sweeteners, Coffee Creamer, and Sugar-free Products. This will help patient to have stable blood glucose profile and potentially avoid unintended weight gain.   - I encouraged the patient to switch to unprocessed or minimally processed complex starch and increased protein intake (animal or plant source), fruits, and vegetables.   - Patient is advised to stick to a routine mealtimes to eat 3 meals a day and avoid unnecessary snacks (to snack only to correct hypoglycemia).  - I have approached her with the following individualized plan to manage her diabetes and patient agrees:   -She is advised to continue Ozempic  2 mg SQ weekly and restart Glipizide  5 mg XL daily with her first meal of the day.  -she is encouraged to start monitoring glucose once daily (2-3 times per week- during the holidays), before breakfast, and to reach out if glucose is less than 70 or above 300 for 3 tests in a row.  - Adjustment parameters are given to her for hypo and hyperglycemia in writing.  -She did not tolerate Metformin in the past due to GI side effects.  She also did not tolerate the Rybelsus , caused similar GI issues.  - Specific targets for  A1c; LDL, HDL, and Triglycerides were discussed with the  patient.  2) Blood Pressure /Hypertension:  her blood pressure is controlled to target.   she is advised to continue her current medications as prescribed by PCP.  3) Lipids/Hyperlipidemia:    Review of her recent lipid panel from 07/19/23 showed uncontrolled LDL at 108 and elevated triglycerides of 251 (slowly improving).  she is advised to continue Crestor 10 mg daily at bedtime.  Side effects and precautions discussed with her.  She does have elevated LFTs too, was told in the past she had fatty liver.  She is encouraged to cut out fried, fatty foods and red meats.  4)  Weight/Diet:  her Body mass index is 31.7 kg/m.  -  clearly complicating her diabetes care.   she is a candidate for weight loss. I discussed with her the fact that loss of 5 - 10% of her  current body weight will have the most impact on her diabetes management.  Exercise, and detailed carbohydrates information provided  -  detailed on discharge instructions.  5) Chronic Care/Health Maintenance: -she is not on ACEI/ARB and is on Statin medications and is encouraged to initiate and continue to follow up with Ophthalmology, Dentist, Podiatrist at least yearly or according to recommendations, and advised to stay away from smoking. I have recommended yearly flu vaccine and pneumonia vaccine at least every 5 years; moderate intensity exercise for up to 150 minutes weekly; and sleep for at least 7 hours a day.  6) Vitamin d  deficiency Her recent vitamin D  level from 07/19/23 was 17.7.  She is  not on any supplementation.  I discussed and initiated Ergocalciferol  50000 units po weekly x 6 months, at which time we can likely change over to OTC treatment.  - she is advised to maintain close follow up with Marvine Rush, MD for primary care needs, as well as her other providers for optimal and coordinated care.     I spent  23  minutes in the care of the patient today including review of labs from CMP, Lipids, Thyroid Function,  Hematology (current and previous including abstractions from other facilities); face-to-face time discussing  her blood glucose readings/logs, discussing hypoglycemia and hyperglycemia episodes and symptoms, medications doses, her options of short and long term treatment based on the latest standards of care / guidelines;  discussion about incorporating lifestyle medicine;  and documenting the encounter. Risk reduction counseling performed per USPSTF guidelines to reduce obesity and cardiovascular risk factors.     Please refer to Patient Instructions for Blood Glucose Monitoring and Insulin /Medications Dosing Guide  in media tab for additional information. Please  also refer to  Patient Self Inventory in the Media  tab for reviewed elements of pertinent patient history.  Kimberly Marks participated in the discussions, expressed understanding, and voiced agreement with the above plans.  All questions were answered to her satisfaction. she is encouraged to contact clinic should she have any questions or concerns prior to her return visit.     Follow up plan: - Return in about 3 months (around 01/31/2024) for Diabetes F/U with A1c in office, No previsit labs, Bring meter and logs.  Benton Rio, [facility] [address] [place], [place] [address] Phone: [phone] Fax: [phone]  10/31/2023, 3:33 PM

## 2024-01-04 ENCOUNTER — Other Ambulatory Visit: Payer: Self-pay | Admitting: Nurse Practitioner

## 2024-02-06 ENCOUNTER — Encounter: Payer: Self-pay | Admitting: Nurse Practitioner

## 2024-02-06 ENCOUNTER — Ambulatory Visit: Admitting: Nurse Practitioner

## 2024-02-06 VITALS — BP 114/78 | HR 98 | Ht 67.0 in | Wt 202.8 lb

## 2024-02-06 DIAGNOSIS — Z7984 Long term (current) use of oral hypoglycemic drugs: Secondary | ICD-10-CM

## 2024-02-06 DIAGNOSIS — I10 Essential (primary) hypertension: Secondary | ICD-10-CM | POA: Diagnosis not present

## 2024-02-06 DIAGNOSIS — E1165 Type 2 diabetes mellitus with hyperglycemia: Secondary | ICD-10-CM

## 2024-02-06 DIAGNOSIS — Z7985 Long-term (current) use of injectable non-insulin antidiabetic drugs: Secondary | ICD-10-CM | POA: Diagnosis not present

## 2024-02-06 DIAGNOSIS — E782 Mixed hyperlipidemia: Secondary | ICD-10-CM | POA: Diagnosis not present

## 2024-02-06 LAB — POCT GLYCOSYLATED HEMOGLOBIN (HGB A1C): Hemoglobin A1C: 8.6 % — AB (ref 4.0–5.6)

## 2024-02-06 MED ORDER — TIRZEPATIDE 7.5 MG/0.5ML ~~LOC~~ SOAJ: 7.5000 mg | SUBCUTANEOUS | 1 refills | Status: AC

## 2024-02-06 NOTE — Progress Notes (Signed)
 "                                                                        Endocrinology Follow Up Note       02/06/2024, 4:53 PM   Subjective:    Patient ID: Kimberly Marks, female    DOB: [birth year].  Kimberly Marks is being seen in follow up after being seen in consultation for management of currently uncontrolled symptomatic diabetes requested by  Marvine Rush, MD.   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   Sleep apnea     Past Surgical History:  Procedure Laterality Date   ABDOMINAL HYSTERECTOMY     ABDOMINAL SURGERY     CARPAL TUNNEL RELEASE Bilateral    LITHOTRIPSY     TONSILLECTOMY      Social History   Socioeconomic History   Marital status: Single    Spouse name: Not on file   Number of children: Not on file   Years of education: Not on file   Highest education level: Not on file  Occupational History   Not on file  Tobacco Use   Smoking status: Former    Current packs/day: 1.00    Types: Cigarettes   Smokeless tobacco: Former    Quit date: 11/24/2012  Vaping Use   Vaping status: Former  Substance and Sexual Activity   Alcohol use: Yes    Alcohol/week: 0.0 standard drinks of alcohol    Comment: occasionally   Drug use: No   Sexual activity: Yes    Birth control/protection: Surgical  Other Topics Concern   Not on file  Social History Narrative   Not on file   Social Drivers of Health   Tobacco Use: Medium Risk (02/06/2024)   Patient History    Smoking Tobacco Use: Former    Smokeless Tobacco Use: Former    Passive Exposure: Not on Stage Manager: Not on [employer] Insecurity: Not on file  Transportation Needs: Not on file  Physical Activity: Not on file  Stress: Not on file  Social Connections: Not on file  Depression (PHQ2-9): Not on file  Alcohol Screen: Not on file   Housing: Not on file  Utilities: Not on file  Health Literacy: Not on file    Family History  Problem Relation Age of Onset   Stroke Mother    Hypertension Mother    COPD Mother    Hyperlipidemia Father    Diabetes Maternal Grandmother    Heart disease Maternal Grandmother    Lung cancer Paternal Grandmother     Outpatient Encounter Medications as of 02/06/2024  Medication Sig   bisoprolol -hydrochlorothiazide  (ZIAC ) 2.5-6.25 MG tablet Take 1 tablet by mouth at bedtime.   celecoxib (CELEBREX) 200 MG capsule Take 200 mg by mouth 2 (two) times daily as needed.   gabapentin  (NEURONTIN ) 300 MG capsule Take 1 capsule (300 mg total) by mouth 2 (two) times daily. (Patient taking differently: Take 300 mg by mouth 2 (two) times daily. Patient reports that she may  use once a day)   glucose blood (ACCU-CHEK GUIDE TEST) test strip Use one (1) test strip to check blood glucose one (1) times daily as directed by provider.   HYDROcodone -acetaminophen  (NORCO/VICODIN) 5-325 MG tablet Take 1 tablet by mouth every 6 (six) hours as needed for severe pain.   ibuprofen  (ADVIL ) 200 MG tablet Take 600 mg by mouth every 6 (six) hours as needed for mild pain or moderate pain (for dental pain).    Ipratropium-Albuterol  (COMBIVENT RESPIMAT) 20-100 MCG/ACT AERS respimat Inhale 1 puff into the lungs every 6 (six) hours as needed for wheezing or shortness of breath.   tirzepatide  (MOUNJARO ) 7.5 MG/0.5ML Pen Inject 7.5 mg into the skin once a week.   Vitamin D , Ergocalciferol , (DRISDOL ) 1.25 MG (50000 UNIT) CAPS capsule Take 1 capsule by mouth once a week   [DISCONTINUED] Semaglutide , 2 MG/DOSE, 8 MG/3ML SOPN Inject 2 mg as directed once a week.   glipiZIDE  (GLUCOTROL  XL) 5 MG 24 hr tablet Take 1 tablet (5 mg total) by mouth daily with breakfast. (Patient not taking: Reported on 02/06/2024)   No facility-administered encounter medications on file as of 02/06/2024.    ALLERGIES: No Known Allergies  VACCINATION  STATUS: Immunization History  Administered Date(s) Administered   Moderna Sars-Covid-2 Vaccination 03/15/2019, 04/16/2019    Diabetes She presents for her follow-up diabetic visit. She has type 2 diabetes mellitus. Onset time: diagnosed at approx age of 64. Her disease course has been stable. There are no hypoglycemic associated symptoms. Associated symptoms include blurred vision, polydipsia and polyuria. Symptoms are stable. There are no diabetic complications. Risk factors for coronary artery disease include diabetes mellitus, dyslipidemia, family history, obesity, hypertension and tobacco exposure (quit smokin 10 years ago). Current diabetic treatments: Ozempic  only. She is compliant with treatment all of the time. Her weight is fluctuating minimally. She is following a generally unhealthy diet. When asked about meal planning, she reported none. She has not had a previous visit with a dietitian. She participates in exercise intermittently. (She presents today with no meter or logs to review, has not been monitoring glucose at home optimally.  Her POCT A1c today is 8.6%, increasing from last visit of 8.3%.  She has tolerated the Ozempic  well without any unpleasant side effects.  She notes she has been more stressed recently, has had multiple deaths in the family.) An ACE inhibitor/angiotensin II receptor blocker is not being taken. She does not see a podiatrist.Eye exam is current.    Review of systems  Constitutional: +stable body weight,  current Body mass index is 31.76 kg/m. , no fatigue, no subjective hyperthermia, no subjective hypothermia Eyes: no blurry vision, no xerophthalmia ENT: no sore throat, no nodules palpated in throat, no dysphagia/odynophagia, no hoarseness Cardiovascular: no chest pain, no shortness of breath, no palpitations, no leg swelling Respiratory: no cough, no shortness of breath Gastrointestinal: no nausea/vomiting/diarrhea Musculoskeletal: no muscle/joint  aches Skin: no rashes, no hyperemia Neurological: no tremors, no numbness, no tingling, no dizziness Psychiatric: no depression, no anxiety  Objective:     BP 114/78 (BP Location: Right Arm, Patient Position: Sitting, Cuff Size: Large)   Pulse 98   Ht 5' 7 (1.702 m)   Wt 202 lb 12.8 oz (92 kg)   BMI 31.76 kg/m   Wt Readings from Last 3 Encounters:  02/06/24 202 lb 12.8 oz (92 kg)  10/31/23 202 lb 6.4 oz (91.8 kg)  07/26/23 202 lb (91.6 kg)     BP Readings from Last 3 Encounters:  02/06/24 114/78  10/31/23 110/80  07/26/23 104/86      Physical Exam- Limited  Constitutional:  Body mass index is 31.76 kg/m. , not in acute distress, normal state of mind Eyes:  EOMI, no exophthalmos Musculoskeletal: no gross deformities, strength intact in all four extremities, no gross restriction of joint movements Skin:  no rashes, no hyperemia Neurological: no tremor with outstretched hands   Diabetic Foot Exam - Simple   No data filed      CMP ( most recent) CMP     Component Value Date/Time   NA 137 07/19/2023 1113   K 4.5 07/19/2023 1113   CL 100 07/19/2023 1113   CO2 20 07/19/2023 1113   GLUCOSE 231 (H) 07/19/2023 1113   GLUCOSE 287 (H) 07/03/2022 1135   BUN 10 07/19/2023 1113   CREATININE 0.79 07/19/2023 1113   CALCIUM 9.8 07/19/2023 1113   PROT 7.4 07/19/2023 1113   ALBUMIN 4.5 07/19/2023 1113   AST 29 07/19/2023 1113   ALT 43 (H) 07/19/2023 1113   ALKPHOS 122 (H) 07/19/2023 1113   BILITOT 0.5 07/19/2023 1113   EGFR 89 07/19/2023 1113   GFRNONAA >60 07/03/2022 1135     Diabetic Labs (most recent): Lab Results  Component Value Date   HGBA1C 8.6 (A) 02/06/2024   HGBA1C 8.3 (A) 10/31/2023   HGBA1C 10.2 (A) 04/26/2023   MICROALBUR 30 04/26/2023     Lipid Panel ( most recent) Lipid Panel     Component Value Date/Time   CHOL 191 07/19/2023 1113   TRIG 251 (H) 07/19/2023 1113   HDL 40 07/19/2023 1113   CHOLHDL 4.8 (H) 07/19/2023 1113   LDLCALC 108 (H)  07/19/2023 1113   LABVLDL 43 (H) 07/19/2023 1113      Lab Results  Component Value Date   TSH 1.120 07/19/2023   FREET4 1.10 07/19/2023           Assessment & Plan:   1) Type 2 diabetes mellitus with hyperglycemia, without long-term current use of insulin  (HCC)  She presents today with no meter or logs to review, has not been monitoring glucose at home optimally.  Her POCT A1c today is 8.6%, increasing from last visit of 8.3%.  She has tolerated the Ozempic  well without any unpleasant side effects.  She notes she has been more stressed recently, has had multiple deaths in the family.  - Kimberly Marks has currently uncontrolled symptomatic type 2 DM since 54 years of age.   -Recent labs reviewed.  - I had a long discussion with her about the progressive nature of diabetes and the pathology behind its complications. -her diabetes is not currently complicated but she remains at a high risk for more acute and chronic complications which include CAD, CVA, CKD, retinopathy, and neuropathy. These are all discussed in detail with her.  The following Lifestyle Medicine recommendations according to American College of Lifestyle Medicine [facility]) were discussed and offered to patient and she agrees to start the journey:  A. Whole Foods, Plant-based plate comprising of fruits and vegetables, plant-based proteins, whole-grain carbohydrates was discussed in detail with the patient.   A list for source of those nutrients were also provided to the patient.  Patient will use only water or unsweetened tea for hydration. B.  The need to stay away from risky substances including alcohol, smoking; obtaining 7 to 9 hours of restorative sleep, at least 150 minutes of moderate intensity exercise weekly, the importance of healthy social connections,  and stress  reduction techniques were discussed. C.  A full color page of  Calorie density of various food groups per pound showing examples of each food groups was  provided to the patient.  - Nutritional counseling repeated/built upon at each appointment.  - The patient admits there is a room for improvement in their diet and drink choices. -  Suggestion is made for the patient to avoid simple carbohydrates from their diet including Cakes, Sweet Desserts / Pastries, Ice Cream, Soda (diet and regular), Sweet Tea, Candies, Chips, Cookies, Sweet Pastries, Store Bought Juices, Alcohol in Excess of 1-2 drinks a day, Artificial Sweeteners, Coffee Creamer, and Sugar-free Products. This will help patient to have stable blood glucose profile and potentially avoid unintended weight gain.   - I encouraged the patient to switch to unprocessed or minimally processed complex starch and increased protein intake (animal or plant source), fruits, and vegetables.   - Patient is advised to stick to a routine mealtimes to eat 3 meals a day and avoid unnecessary snacks (to snack only to correct hypoglycemia).  - I have approached her with the following individualized plan to manage her diabetes and patient agrees:   -She is advised to continue Ozempic  2 mg SQ weekly.  Will try switching her to Mounjaro  7.5 mg SQ weekly.  She has been on Trulicity and Ozempic  in the past with sub-par results.  She could not tolerate Metformin or Rybelsus , nor Glipizide  as it caused hypoglycemia.   -she is encouraged to start monitoring glucose once daily, before breakfast, and to reach out if glucose is less than 70 or above 300 for 3 tests in a row.  - Adjustment parameters are given to her for hypo and hyperglycemia in writing.  -She did not tolerate Metformin in the past due to GI side effects.  She also did not tolerate the Rybelsus , caused similar GI issues.  - Specific targets for  A1c; LDL, HDL, and Triglycerides were discussed with the patient.  2) Blood Pressure /Hypertension:  her blood pressure is controlled to target.   she is advised to continue her current medications as  prescribed by PCP.  3) Lipids/Hyperlipidemia:    Review of her recent lipid panel from 07/19/23 showed uncontrolled LDL at 108 and elevated triglycerides of 251 (slowly improving).  she is advised to continue Crestor 10 mg daily at bedtime.  Side effects and precautions discussed with her.  She does have elevated LFTs too, was told in the past she had fatty liver.  She is encouraged to cut out fried, fatty foods and red meats.  She had appt that had to be rescheduled with PCP for physical with fasting labs, will defer labs to them.  4)  Weight/Diet:  her Body mass index is 31.76 kg/m.  -  clearly complicating her diabetes care.   she is a candidate for weight loss. I discussed with her the fact that loss of 5 - 10% of her  current body weight will have the most impact on her diabetes management.  Exercise, and detailed carbohydrates information provided  -  detailed on discharge instructions.  5) Chronic Care/Health Maintenance: -she is not on ACEI/ARB and is on Statin medications and is encouraged to initiate and continue to follow up with Ophthalmology, Dentist, Podiatrist at least yearly or according to recommendations, and advised to stay away from smoking. I have recommended yearly flu vaccine and pneumonia vaccine at least every 5 years; moderate intensity exercise for up to 150 minutes weekly; and sleep  for at least 7 hours a day.  6) Vitamin d  deficiency Her recent vitamin D  level from 07/19/23 was 17.7.  She is not on any supplementation.  I discussed and initiated Ergocalciferol  50000 units po weekly x 6 months, at which time we can likely change over to OTC treatment.  - she is advised to maintain close follow up with Marvine Rush, MD for primary care needs, as well as her other providers for optimal and coordinated care.     I spent  43  minutes in the care of the patient today including review of labs from CMP, Lipids, Thyroid Function, Hematology (current and previous including  abstractions from other facilities); face-to-face time discussing  her blood glucose readings/logs, discussing hypoglycemia and hyperglycemia episodes and symptoms, medications doses, her options of short and long term treatment based on the latest standards of care / guidelines;  discussion about incorporating lifestyle medicine;  and documenting the encounter. Risk reduction counseling performed per USPSTF guidelines to reduce obesity and cardiovascular risk factors.     Please refer to Patient Instructions for Blood Glucose Monitoring and Insulin /Medications Dosing Guide  in media tab for additional information. Please  also refer to  Patient Self Inventory in the Media  tab for reviewed elements of pertinent patient history.  Kimberly Marks participated in the discussions, expressed understanding, and voiced agreement with the above plans.  All questions were answered to her satisfaction. she is encouraged to contact clinic should she have any questions or concerns prior to her return visit.    Follow up plan: - Return in about 3 months (around 05/05/2024) for Diabetes F/U with A1c in office, No previsit labs.  Benton Rio, [facility] [address] [place], [place] [address] Phone: [phone] Fax: [phone]  02/06/2024, 4:53 PM    "

## 2024-05-15 ENCOUNTER — Ambulatory Visit: Admitting: Nurse Practitioner
